# Patient Record
Sex: Male | Born: 1935 | Race: White | Hispanic: No | Marital: Single | Smoking: Never smoker
Health system: Southern US, Community
[De-identification: ages and names within clinical notes are randomized; demographics above are authoritative.]

## PROBLEM LIST (undated history)

## (undated) DIAGNOSIS — M4800 Spinal stenosis, site unspecified: Secondary | ICD-10-CM

## (undated) DIAGNOSIS — M5126 Other intervertebral disc displacement, lumbar region: Secondary | ICD-10-CM

## (undated) DIAGNOSIS — M5136 Other intervertebral disc degeneration, lumbar region: Secondary | ICD-10-CM

## (undated) DIAGNOSIS — K219 Gastro-esophageal reflux disease without esophagitis: Secondary | ICD-10-CM

## (undated) DIAGNOSIS — I472 Ventricular tachycardia: Secondary | ICD-10-CM

## (undated) DIAGNOSIS — J189 Pneumonia, unspecified organism: Secondary | ICD-10-CM

## (undated) DIAGNOSIS — M549 Dorsalgia, unspecified: Secondary | ICD-10-CM

## (undated) DIAGNOSIS — S42309A Unspecified fracture of shaft of humerus, unspecified arm, initial encounter for closed fracture: Secondary | ICD-10-CM

## (undated) DIAGNOSIS — I10 Essential (primary) hypertension: Secondary | ICD-10-CM

## (undated) DIAGNOSIS — M1990 Unspecified osteoarthritis, unspecified site: Secondary | ICD-10-CM

## (undated) DIAGNOSIS — M51369 Other intervertebral disc degeneration, lumbar region without mention of lumbar back pain or lower extremity pain: Secondary | ICD-10-CM

## (undated) DIAGNOSIS — M5430 Sciatica, unspecified side: Secondary | ICD-10-CM

## (undated) DIAGNOSIS — H409 Unspecified glaucoma: Secondary | ICD-10-CM

## (undated) HISTORY — PX: BUTTOCK MASS EXCISION: SHX1279

## (undated) HISTORY — PX: CATARACT EXTRACTION W/ INTRAOCULAR LENS  IMPLANT, BILATERAL: SHX1307

## (undated) HISTORY — PX: GROIN EXPLORATION: SHX1713

## (undated) HISTORY — PX: OTHER SURGICAL HISTORY: SHX169

---

## 1983-04-01 DIAGNOSIS — I472 Ventricular tachycardia, unspecified: Secondary | ICD-10-CM

## 1983-04-01 HISTORY — DX: Ventricular tachycardia: I47.2

## 1983-04-01 HISTORY — DX: Ventricular tachycardia, unspecified: I47.20

## 2007-06-18 ENCOUNTER — Ambulatory Visit: Payer: Self-pay | Admitting: Internal Medicine

## 2008-07-05 ENCOUNTER — Ambulatory Visit: Payer: Self-pay | Admitting: Internal Medicine

## 2008-11-16 ENCOUNTER — Ambulatory Visit: Payer: Self-pay | Admitting: Gastroenterology

## 2011-07-16 ENCOUNTER — Encounter: Payer: Self-pay | Admitting: Neurology

## 2011-07-30 ENCOUNTER — Encounter: Payer: Self-pay | Admitting: Neurology

## 2011-08-30 ENCOUNTER — Encounter: Payer: Self-pay | Admitting: Neurology

## 2011-11-19 ENCOUNTER — Ambulatory Visit: Payer: Self-pay | Admitting: Internal Medicine

## 2012-09-23 ENCOUNTER — Ambulatory Visit: Payer: Self-pay | Admitting: Internal Medicine

## 2014-01-24 ENCOUNTER — Other Ambulatory Visit: Payer: Self-pay | Admitting: Neurological Surgery

## 2014-02-21 ENCOUNTER — Inpatient Hospital Stay (HOSPITAL_COMMUNITY): Admission: RE | Admit: 2014-02-21 | Payer: Self-pay | Source: Ambulatory Visit

## 2014-04-10 ENCOUNTER — Other Ambulatory Visit: Payer: Self-pay | Admitting: Neurological Surgery

## 2014-04-18 ENCOUNTER — Other Ambulatory Visit (HOSPITAL_COMMUNITY): Payer: Self-pay

## 2014-04-26 ENCOUNTER — Encounter (HOSPITAL_COMMUNITY): Admission: RE | Payer: Self-pay | Source: Ambulatory Visit

## 2014-04-26 ENCOUNTER — Inpatient Hospital Stay (HOSPITAL_COMMUNITY): Admission: RE | Admit: 2014-04-26 | Payer: Self-pay | Source: Ambulatory Visit | Admitting: Neurological Surgery

## 2014-04-26 SURGERY — LUMBAR LAMINECTOMY/DECOMPRESSION MICRODISCECTOMY 2 LEVELS
Anesthesia: General | Site: Back | Laterality: Bilateral

## 2015-09-14 ENCOUNTER — Encounter: Payer: Self-pay | Admitting: Emergency Medicine

## 2015-09-14 ENCOUNTER — Emergency Department: Payer: Medicare HMO

## 2015-09-14 ENCOUNTER — Emergency Department
Admission: EM | Admit: 2015-09-14 | Discharge: 2015-09-15 | Disposition: A | Discharge status: Home / Self Care | Payer: Medicare HMO | Attending: Emergency Medicine | Admitting: Emergency Medicine

## 2015-09-14 DIAGNOSIS — Z79899 Other long term (current) drug therapy: Secondary | ICD-10-CM | POA: Diagnosis not present

## 2015-09-14 DIAGNOSIS — W1839XA Other fall on same level, initial encounter: Secondary | ICD-10-CM | POA: Diagnosis not present

## 2015-09-14 DIAGNOSIS — Y939 Activity, unspecified: Secondary | ICD-10-CM | POA: Diagnosis not present

## 2015-09-14 DIAGNOSIS — I10 Essential (primary) hypertension: Secondary | ICD-10-CM | POA: Insufficient documentation

## 2015-09-14 DIAGNOSIS — M79602 Pain in left arm: Secondary | ICD-10-CM

## 2015-09-14 DIAGNOSIS — S42292A Other displaced fracture of upper end of left humerus, initial encounter for closed fracture: Secondary | ICD-10-CM | POA: Diagnosis not present

## 2015-09-14 DIAGNOSIS — Y929 Unspecified place or not applicable: Secondary | ICD-10-CM | POA: Insufficient documentation

## 2015-09-14 DIAGNOSIS — S42302A Unspecified fracture of shaft of humerus, left arm, initial encounter for closed fracture: Secondary | ICD-10-CM

## 2015-09-14 DIAGNOSIS — Y999 Unspecified external cause status: Secondary | ICD-10-CM | POA: Insufficient documentation

## 2015-09-14 HISTORY — DX: Spinal stenosis, site unspecified: M48.00

## 2015-09-14 HISTORY — DX: Essential (primary) hypertension: I10

## 2015-09-14 HISTORY — DX: Dorsalgia, unspecified: M54.9

## 2015-09-14 HISTORY — DX: Ventricular tachycardia: I47.2

## 2015-09-14 HISTORY — DX: Other intervertebral disc displacement, lumbar region: M51.26

## 2015-09-14 HISTORY — DX: Sciatica, unspecified side: M54.30

## 2015-09-14 HISTORY — DX: Other intervertebral disc degeneration, lumbar region without mention of lumbar back pain or lower extremity pain: M51.369

## 2015-09-14 HISTORY — DX: Unspecified fracture of shaft of humerus, unspecified arm, initial encounter for closed fracture: S42.309A

## 2015-09-14 HISTORY — DX: Other intervertebral disc degeneration, lumbar region: M51.36

## 2015-09-14 MED ORDER — HYDROMORPHONE HCL 1 MG/ML IJ SOLN
1.0000 mg | Freq: Once | INTRAMUSCULAR | Status: AC
  Administered 2015-09-14: 1 mg via INTRAVENOUS
  Filled 2015-09-14: qty 1

## 2015-09-14 MED ORDER — ONDANSETRON HCL 4 MG/2ML IJ SOLN
4.0000 mg | Freq: Once | INTRAMUSCULAR | Status: AC
  Administered 2015-09-14: 4 mg via INTRAVENOUS
  Filled 2015-09-14: qty 2

## 2015-09-14 NOTE — ED Notes (Signed)
Pt states fell on concrete one week ago. Pt with proximal left humerus fracture. Pt states increased pain to left arm over 2 days. Pt with dark black bruising and swelling from elbow to approx 2 inches below shoulder. Pt with swelling and bruising noted to left hand. Arm is warm to touch, cms intact to left fingers.

## 2015-09-14 NOTE — ED Notes (Signed)
Call bell at right side, pt denies offer for warm blanket.

## 2015-09-14 NOTE — ED Notes (Signed)
Scotty CourtStafford ,MD consulted. MD made aware of presenting complaints and triage assessment. MD with VORB for PIV, Zofran 4mg  IVP, Dilaudid 1mg  IVP, and a plain film of the LEFT humerus. Orders to be entered and carried by this RN.

## 2015-09-15 MED ORDER — OXYCODONE-ACETAMINOPHEN 5-325 MG PO TABS: 1.0000 | ORAL_TABLET | ORAL | Status: DC | PRN

## 2015-09-15 MED ORDER — OXYCODONE-ACETAMINOPHEN 5-325 MG PO TABS
1.0000 | ORAL_TABLET | Freq: Once | ORAL | Status: AC
  Administered 2015-09-15: 1 via ORAL
  Filled 2015-09-15: qty 1

## 2015-09-15 NOTE — ED Provider Notes (Signed)
[facility] Provider Note   ____________________________________________  Time seen: Approximately 12:03 AM  I have reviewed the triage vital signs and the nursing notes.   HISTORY  Chief Complaint Arm Pain    HPI Paul Hobbs is a 80 y.o. male who presents to the ED from home via EMS with a chief complaint of left arm pain. Patient fell 1 week ago; mechanical fall secondary to "bad back". Denies LOC. Seen at urgent care with xrays demonstrating left humerus fracture. Ran out of vicodin today; also removed sling for the first time and noted extensive bruising to arm. Has an appointment with Dr. Joice Lofts next week but is concerned he will be in pain through the weekend. Denies recent fever, chills, chest pain, shortness of breath, abdominal pain, nausea, vomiting, diarrhea. Nothing makes his pain better; movement makes his pain worse.   Past Medical History  Diagnosis Date  . Humerus fracture   . Back pain   . Spinal stenosis   . Bulging lumbar disc   . Sciatica   . Hypertension   . V tach (HCC)     There are no active problems to display for this patient.   No past surgical history on file.  Current Outpatient Rx  Name  Route  Sig  Dispense  Refill  . acetaminophen (TYLENOL) 325 MG tablet   Oral   Take 650 mg by mouth every 6 (six) hours as needed for mild pain.         Marland Kitchen atorvastatin (LIPITOR) 20 MG tablet   Oral   Take 20 mg by mouth every evening.         . enalapril (VASOTEC) 20 MG tablet   Oral   Take 20 mg by mouth 2 (two) times daily.         Marland Kitchen esomeprazole (NEXIUM) 40 MG capsule   Oral   Take 40 mg by mouth daily at 12 noon.         . hydrochlorothiazide (HYDRODIURIL) 25 MG tablet   Oral   Take 25 mg by mouth daily.         Marland Kitchen HYDROcodone-acetaminophen (NORCO/VICODIN) 5-325 MG tablet   Oral   Take 1-2 tablets by mouth.         . Magnesium 250 MG TABS   Oral   Take 250 mg by mouth daily.         . metoprolol succinate (TOPROL-XL) 50 MG 24 hr tablet   Oral   Take 50 mg by mouth daily.         . Multiple Vitamin (MULTIVITAMIN WITH MINERALS) TABS tablet   Oral   Take 1 tablet by mouth daily.         Marland Kitchen omega-3 acid ethyl esters (LOVAZA) 1 g capsule   Oral   Take 1 g by mouth 2 (two) times daily.         . tamsulosin (FLOMAX) 0.4 MG CAPS capsule   Oral   Take 0.4 mg by mouth daily.         Marland Kitchen oxyCODONE-acetaminophen (ROXICET) 5-325 MG tablet   Oral   Take 1 tablet by mouth every 4 (four) hours as needed for severe pain.   30 tablet   0     Allergies Penicillins  No family history on file.  Social History Social History  Substance Use Topics  . Smoking status: Never Smoker   . Smokeless tobacco: Never Used  . Alcohol Use:  Yes    Review of Systems  Constitutional: No fever/chills. Eyes: No visual changes. ENT: No sore throat. Cardiovascular: Denies chest pain. Respiratory: Denies shortness of breath. Gastrointestinal: No abdominal pain.  No nausea, no vomiting.  No diarrhea.  No constipation. Genitourinary: Negative for dysuria. Musculoskeletal: Positive for left arm pain. Negative for back pain. Skin: Negative for rash. Neurological: Negative for headaches, focal weakness or numbness.  10-point ROS otherwise negative.  ____________________________________________   PHYSICAL EXAM:  VITAL SIGNS: ED Triage Vitals  Enc Vitals Group     BP 09/14/15 2228 172/80 mmHg     Pulse Rate 09/14/15 2228 79     Resp 09/14/15 2228 16     Temp 09/14/15 2228 97.9 F (36.6 C)     Temp Source 09/14/15 2228 Oral     SpO2 09/14/15 2228 98 %     Weight 09/14/15 2228 185 lb (83.915 kg)     Height 09/14/15 2228 5\' 8"  (1.727 m)     Head Cir --      Peak Flow --      Pain Score 09/14/15 2228 3     Pain Loc --      Pain Edu? --      Excl. in GC? --     Constitutional: Alert and oriented. Well appearing and in no acute distress. Eyes: Conjunctivae  are normal. PERRL. EOMI. Head: Atraumatic. Nose: No congestion/rhinnorhea. Mouth/Throat: Mucous membranes are moist.  Oropharynx non-erythematous. Neck: No stridor.  No cervical spine tenderness to palpation. Cardiovascular: Normal rate, regular rhythm. Grossly normal heart sounds.  Good peripheral circulation. Respiratory: Normal respiratory effort.  No retractions. Lungs CTAB. Gastrointestinal: Soft and nontender. No distention. No abdominal bruits. No CVA tenderness. Musculoskeletal:  LUE: Extensive eccymosis to left upper arm. Moderate swelling to arm, forearm and hand. Compartments are not tense nor tight; low suspicion for compartment syndrome. 2+ radial pulses. Limited ROM secondary to pain.  Neurologic:  Normal speech and language. No gross focal neurologic deficits are appreciated. No gait instability. Skin:  Skin is warm, dry and intact. No rash noted. Psychiatric: Mood and affect are normal. Speech and behavior are normal.  ____________________________________________   LABS (all labs ordered are listed, but only abnormal results are displayed)  Labs Reviewed - No data to display ____________________________________________  EKG  None  ____________________________________________  RADIOLOGY  Left humerus xrays (viewed by me, interpreted per Dr. Andria MeuseStevens): Comminuted, displaced, and impacted fractures of the proximal left humerus. Degenerative changes in the left shoulder and left elbow.  Left hand complete (viewed by me, interpreted per Dr. Andria MeuseStevens): Prominent degenerative changes throughout the left hand and wrist. No acute bony abnormalities. Soft tissue swelling. ____________________________________________   PROCEDURES  Procedure(s) performed: None  Critical Care performed: No  ____________________________________________   INITIAL IMPRESSION / ASSESSMENT AND PLAN / ED COURSE  Pertinent labs & imaging results that were available during my care of the  patient were reviewed by me and considered in my medical decision making (see chart for details).  80 year old male who presents 1 week s/p left humerus fracture with complaints of running out of pain medicines and associated fracture pain. Also had ortho PA Floyce StakesGaines evaluate patient in the ED as he [facility]. Will place in shoulder immobilizer, analgesia and patient will keep upcoming appointment as scheduled. Will also provide walker for stability. Strict return precautions given. Patient verbalizes understanding and agrees with plan of care. ____________________________________________   FINAL CLINICAL IMPRESSION(S) / ED  DIAGNOSES  Final diagnoses:  Humerus fracture, left, closed, initial encounter  Left arm pain      NEW MEDICATIONS STARTED DURING THIS VISIT:  New Prescriptions   OXYCODONE-ACETAMINOPHEN (ROXICET) 5-325 MG TABLET    Take 1 tablet by mouth every 4 (four) hours as needed for severe pain.     Note:  This document was prepared using Dragon voice recognition software and may include unintentional dictation errors.    Irean Hong, MD 09/15/15 [phone]

## 2015-09-15 NOTE — Discharge Instructions (Signed)
1. You may take pain medicine as needed (percocet #30). 2. Keep shoulder immobilizer in place. You may remove to bathe. 3. Return to the ER for worsening symptoms, persistent vomiting, difficulty breathing or other concerns.  Humerus Fracture Treated With Immobilization The humerus is the large bone in your upper arm. You have a broken (fractured) humerus. These fractures are easily diagnosed with X-rays. TREATMENT  Simple fractures which will heal without disability are treated with simple immobilization. Immobilization means you will wear a cast, splint, or sling. You have a fracture which will do well with immobilization. The fracture will heal well simply by being held in a good position until it is stable enough to begin range of motion exercises. Do not take part in activities which would further injure your arm.  HOME CARE INSTRUCTIONS   Put ice on the injured area.  Put ice in a plastic bag.  Place a towel between your skin and the bag.  Leave the ice on for 15-20 minutes, 03-04 times a day.  If you have a cast:  Do not scratch the skin under the cast using sharp or pointed objects.  Check the skin around the cast every day. You may put lotion on any red or sore areas.  Keep your cast dry and clean.  If you have a splint:  Wear the splint as directed.  Keep your splint dry and clean.  You may loosen the elastic around the splint if your fingers become numb, tingle, or turn cold or blue.  If you have a sling:  Wear the sling as directed.  Do not put pressure on any part of your cast or splint until it is fully hardened.  Your cast or splint can be protected during bathing with a plastic bag. Do not lower the cast or splint into water.  Only take over-the-counter or prescription medicines for pain, discomfort, or fever as directed by your caregiver.  Do range of motion exercises as instructed by your caregiver.  Follow up as directed by your caregiver. This is very  important in order to avoid permanent injury or disability and chronic pain. SEEK IMMEDIATE MEDICAL CARE IF:   Your skin or nails in the injured arm turn blue or gray.  Your arm feels cold or numb.  You develop severe pain in the injured arm.  You are having problems with the medicines you were given. MAKE SURE YOU:   Understand these instructions.  Will watch your condition.  Will get help right away if you are not doing well or get worse.   This information is not intended to replace advice given to you by your health care provider. Make sure you discuss any questions you have with your health care provider.   Document Released: 06/23/2000 Document Revised: 04/07/2014 Document Reviewed: 08/09/2014 Elsevier Interactive Patient Education [employer]2016 [employer]

## 2015-09-15 NOTE — ED Notes (Signed)
Discussed with pt discharge transporation options. Pt states is not going to be able to get in and out of a taxi. Pt states he would like an ambulance. Pt informed may be reponsible for ems bill of "several hundred dollars". Pt verbalizes understanding.

## 2015-09-17 ENCOUNTER — Emergency Department: Payer: Medicare HMO

## 2015-09-17 ENCOUNTER — Emergency Department
Admission: EM | Admit: 2015-09-17 | Discharge: 2015-09-17 | Disposition: A | Discharge status: Home / Self Care | Payer: Medicare HMO | Attending: Emergency Medicine | Admitting: Emergency Medicine

## 2015-09-17 ENCOUNTER — Encounter: Payer: Self-pay | Admitting: Emergency Medicine

## 2015-09-17 DIAGNOSIS — W19XXXD Unspecified fall, subsequent encounter: Secondary | ICD-10-CM | POA: Insufficient documentation

## 2015-09-17 DIAGNOSIS — I10 Essential (primary) hypertension: Secondary | ICD-10-CM | POA: Diagnosis not present

## 2015-09-17 DIAGNOSIS — S42302G Unspecified fracture of shaft of humerus, left arm, subsequent encounter for fracture with delayed healing: Secondary | ICD-10-CM

## 2015-09-17 DIAGNOSIS — Z79899 Other long term (current) drug therapy: Secondary | ICD-10-CM | POA: Diagnosis not present

## 2015-09-17 DIAGNOSIS — M79622 Pain in left upper arm: Secondary | ICD-10-CM | POA: Diagnosis present

## 2015-09-17 NOTE — ED Notes (Signed)
Pt with  humerous injury recently, was seen here Friday night and was told to come back to ED if more bruising, swelling or discoloration noted to right arm/hand. Pt states arm and hand are  Much worse.

## 2015-09-17 NOTE — ED Provider Notes (Signed)
[facility] Emergency Department Provider Note  ____________________________________________  Time seen: 11:30 AM  I have reviewed the triage vital signs and the nursing notes.   HISTORY  Chief Complaint Arm Swelling    HPI Paul Hobbs is a 80 y.o. male complaining of discoloration of the left hand concern for compartment syndrome. He had a fall about 1 week ago where he had a proximal humerus fracture. This was maintained in a sling, but then he ran out of his medication for pain control and came to the emergency department 3 days ago. At that time he was counseled that if he had worsening discoloration of the hand to come back to be evaluated for compartment syndrome. He reports his pain is actually very well controlled with a shoulder immobilizer that was placed on his last visit. He denies any coldness or numbness in the hand, no pain in the hand. Otherwise feels fine. He has a follow-up appointment with Dr. Joice [facility]Pog[facility] orthopedics tomorrow.  No chest pain or shortness of breath     Past Medical History  Diagnosis Date  . Humerus fracture   . Back pain   . Spinal stenosis   . Bulging lumbar disc   . Sciatica   . Hypertension   . V tach (HCC)      There are no active problems to display for this patient.    History reviewed. No pertinent past surgical history.   Current Outpatient Rx  Name  Route  Sig  Dispense  Refill  . acetaminophen (TYLENOL) 325 MG tablet   Oral   Take 650 mg by mouth every 6 (six) hours as needed for mild pain.         Marland Kitchen. atorvastatin (LIPITOR) 20 MG tablet   Oral   Take 20 mg by mouth every evening.         . enalapril (VASOTEC) 20 MG tablet   Oral   Take 20 mg by mouth 2 (two) times daily.         Marland Kitchen. esomeprazole (NEXIUM) 40 MG capsule   Oral   Take 40 mg by mouth daily at 12 noon.         . hydrochlorothiazide (HYDRODIURIL) 25 MG tablet   Oral   Take 25 mg by mouth daily.         Marland Kitchen.  HYDROcodone-acetaminophen (NORCO/VICODIN) 5-325 MG tablet   Oral   Take 1-2 tablets by mouth.         . Magnesium 250 MG TABS   Oral   Take 250 mg by mouth daily.         . metoprolol succinate (TOPROL-XL) 50 MG 24 hr tablet   Oral   Take 50 mg by mouth daily.         . Multiple Vitamin (MULTIVITAMIN WITH MINERALS) TABS tablet   Oral   Take 1 tablet by mouth daily.         Marland Kitchen. omega-3 acid ethyl esters (LOVAZA) 1 g capsule   Oral   Take 1 g by mouth 2 (two) times daily.         Marland Kitchen. oxyCODONE-acetaminophen (ROXICET) 5-325 MG tablet   Oral   Take 1 tablet by mouth every 4 (four) hours as needed for severe pain.   30 tablet   0   . tamsulosin (FLOMAX) 0.4 MG CAPS capsule   Oral   Take 0.4 mg by mouth daily.  Allergies Penicillins   No family history on file.  Social History Social History  Substance Use Topics  . Smoking status: Never Smoker   . Smokeless tobacco: Never Used  . Alcohol Use: Yes    Review of Systems  Constitutional:   No fever or chills.  Eyes:   No vision changes.  ENT:   No sore throat. No rhinorrhea. Cardiovascular:   No chest pain. Respiratory:   No dyspnea or cough. Gastrointestinal:   Negative for abdominal pain, vomiting and diarrhea.  Genitourinary:   Negative for dysuria or difficulty urinating. Musculoskeletal:   Left upper arm pain due to fracture Neurological:   Negative for headaches 10-point ROS otherwise negative.  ____________________________________________   PHYSICAL EXAM:  VITAL SIGNS: ED Triage Vitals  Enc Vitals Group     BP 09/17/15 1053 133/50 mmHg     Pulse Rate 09/17/15 1053 65     Resp 09/17/15 1053 20     Temp 09/17/15 1053 97.7 F (36.5 C)     Temp Source 09/17/15 1053 Oral     SpO2 09/17/15 1053 100 %     Weight --      Height --      Head Cir --      Peak Flow --      Pain Score --      Pain Loc --      Pain Edu? --      Excl. in GC? --     Vital signs reviewed, nursing  assessments reviewed.   Constitutional:   Alert and oriented. Well appearing and in no distress. Eyes:   No scleral icterus. No conjunctival pallor. PERRL. EOMI.  No nystagmus. ENT   Head:   Normocephalic and atraumatic.   Nose:   No congestion/rhinnorhea. No septal hematoma   Mouth/Throat:   MMM, no pharyngeal erythema. No peritonsillar mass.    Neck:   No stridor. No SubQ emphysema. No meningismus. Hematological/Lymphatic/Immunilogical:   No cervical lymphadenopathy. Cardiovascular:   RRR. Symmetric bilateral radial and DP pulses.  No murmurs. Normal capillary refill in all fingers of the left hand. Respiratory:   Normal respiratory effort without tachypnea nor retractions. Breath sounds are clear and equal bilaterally. No wheezes/rales/rhonchi. Gastrointestinal:   Soft and nontender. Non distended. There is no CVA tenderness.  No rebound, rigidity, or guarding. Genitourinary:   deferred Musculoskeletal:   Diffuse ecchymosis of the left upper extremity and the upper arm forearm and hand. There is also some diffuse mild edema. Tissues are soft, not tense. No crepitus. Full range of motion in the wrist and fingers.. Neurologic:   Normal speech and language.  CN 2-10 normal. Motor grossly intact. No gross focal neurologic deficits are appreciated.  Skin:    Skin is warm, dry and intact. Diffuse ecchymosis over the left upper extremity. There is ecchymosis over the fingers as well, but no bony tenderness. Skin is warm..  ____________________________________________    LABS (pertinent positives/negatives) (all labs ordered are listed, but only abnormal results are displayed) Labs Reviewed - No data to display ____________________________________________   EKG    ____________________________________________    RADIOLOGY  Chest x-ray  unremarkable  ____________________________________________   PROCEDURES   ____________________________________________   INITIAL IMPRESSION / ASSESSMENT AND PLAN / ED COURSE  Pertinent labs & imaging results that were available during my care of the patient were reviewed by me and considered in my medical decision making (see chart for details).  Patient seen to be evaluated for compartment  syndrome because of return precautions given previously. No evidence of compartment syndrome or soft tissue infection or new injury today. Pain is controlled. We'll discharge to follow-up with Dr. Joice Lofts orthopedics tomorrow. 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 This was completed. Vital signs unremarkable, discharge home, no evidence of DVT or PE.     ____________________________________________   FINAL CLINICAL IMPRESSION(S) / ED DIAGNOSES  Final diagnoses:  Humerus fracture, left, with delayed healing, subsequent encounter       Portions of this note were generated with dragon dictation software. Dictation errors may occur despite best attempts at proofreading.   Sharman Cheek, MD 09/17/15 1332

## 2015-12-19 ENCOUNTER — Other Ambulatory Visit: Payer: Self-pay | Admitting: Neurological Surgery

## 2015-12-19 DIAGNOSIS — M48062 Spinal stenosis, lumbar region with neurogenic claudication: Secondary | ICD-10-CM

## 2015-12-31 ENCOUNTER — Ambulatory Visit: Payer: Medicare HMO

## 2016-01-07 ENCOUNTER — Ambulatory Visit
Admission: RE | Admit: 2016-01-07 | Discharge: 2016-01-07 | Disposition: A | Discharge status: Home / Self Care | Payer: Medicare HMO | Source: Ambulatory Visit | Attending: Neurological Surgery | Admitting: Neurological Surgery

## 2016-01-07 DIAGNOSIS — M48061 Spinal stenosis, lumbar region without neurogenic claudication: Secondary | ICD-10-CM | POA: Diagnosis present

## 2016-01-07 DIAGNOSIS — M5136 Other intervertebral disc degeneration, lumbar region: Secondary | ICD-10-CM | POA: Insufficient documentation

## 2016-01-07 DIAGNOSIS — M48062 Spinal stenosis, lumbar region with neurogenic claudication: Secondary | ICD-10-CM

## 2016-01-07 DIAGNOSIS — M5126 Other intervertebral disc displacement, lumbar region: Secondary | ICD-10-CM | POA: Insufficient documentation

## 2016-02-13 HISTORY — PX: BACK SURGERY: SHX140

## 2016-03-15 ENCOUNTER — Emergency Department
Admission: EM | Admit: 2016-03-15 | Discharge: 2016-03-15 | Disposition: A | Discharge status: Home / Self Care | Payer: Medicare HMO | Attending: Emergency Medicine | Admitting: Emergency Medicine

## 2016-03-15 ENCOUNTER — Emergency Department: Payer: Medicare HMO

## 2016-03-15 ENCOUNTER — Encounter: Payer: Self-pay | Admitting: Emergency Medicine

## 2016-03-15 DIAGNOSIS — Z79899 Other long term (current) drug therapy: Secondary | ICD-10-CM | POA: Insufficient documentation

## 2016-03-15 DIAGNOSIS — R42 Dizziness and giddiness: Secondary | ICD-10-CM | POA: Diagnosis not present

## 2016-03-15 DIAGNOSIS — I10 Essential (primary) hypertension: Secondary | ICD-10-CM | POA: Diagnosis not present

## 2016-03-15 DIAGNOSIS — R001 Bradycardia, unspecified: Secondary | ICD-10-CM | POA: Insufficient documentation

## 2016-03-15 LAB — COMPREHENSIVE METABOLIC PANEL
ALT: 19 U/L (ref 17–63)
ANION GAP: 7 (ref 5–15)
AST: 25 U/L (ref 15–41)
Albumin: 3.8 g/dL (ref 3.5–5.0)
Alkaline Phosphatase: 86 U/L (ref 38–126)
BILIRUBIN TOTAL: 0.7 mg/dL (ref 0.3–1.2)
BUN: 19 mg/dL (ref 6–20)
CO2: 28 mmol/L (ref 22–32)
Calcium: 8.8 mg/dL — ABNORMAL LOW (ref 8.9–10.3)
Chloride: 99 mmol/L — ABNORMAL LOW (ref 101–111)
Creatinine, Ser: 1.02 mg/dL (ref 0.61–1.24)
GFR calc Af Amer: >60 mL/min (ref >60)
Glucose, Bld: 110 mg/dL — ABNORMAL HIGH (ref 65–99)
POTASSIUM: 3.8 mmol/L (ref 3.5–5.1)
Sodium: 134 mmol/L — ABNORMAL LOW (ref 135–145)
TOTAL PROTEIN: 6.7 g/dL (ref 6.5–8.1)

## 2016-03-15 LAB — URINALYSIS, COMPLETE (UACMP) WITH MICROSCOPIC
Bacteria, UA: NONE SEEN
Bilirubin Urine: NEGATIVE
GLUCOSE, UA: NEGATIVE mg/dL
Hgb urine dipstick: NEGATIVE
KETONES UR: NEGATIVE mg/dL
LEUKOCYTES UA: NEGATIVE
Nitrite: NEGATIVE
PH: 7 (ref 5.0–8.0)
Protein, ur: NEGATIVE mg/dL
SPECIFIC GRAVITY, URINE: 1.005 (ref 1.005–1.030)
SQUAMOUS EPITHELIAL / LPF: NONE SEEN
WBC, UA: NONE SEEN WBC/hpf (ref 0–5)

## 2016-03-15 LAB — CBC WITH DIFFERENTIAL/PLATELET
Basophils Absolute: 0.1 10*3/uL (ref 0–0.1)
Basophils Relative: 2 %
Eosinophils Absolute: 0.2 10*3/uL (ref 0–0.7)
Eosinophils Relative: 3 %
HEMATOCRIT: 39 % — AB (ref 40.0–52.0)
Hemoglobin: 13.6 g/dL (ref 13.0–18.0)
LYMPHS PCT: 41 %
Lymphs Abs: 2.7 10*3/uL (ref 1.0–3.6)
MCH: 27.9 pg (ref 26.0–34.0)
MCHC: 35 g/dL (ref 32.0–36.0)
MCV: 79.9 fL — AB (ref 80.0–100.0)
MONO ABS: 0.7 10*3/uL (ref 0.2–1.0)
MONOS PCT: 11 %
NEUTROS ABS: 2.8 10*3/uL (ref 1.4–6.5)
Neutrophils Relative %: 43 %
Platelets: 256 10*3/uL (ref 150–440)
RBC: 4.89 MIL/uL (ref 4.40–5.90)
RDW: 14.1 % (ref 11.5–14.5)
WBC: 6.5 10*3/uL (ref 3.8–10.6)

## 2016-03-15 LAB — TROPONIN I: Troponin I: <0.03 ng/mL (ref <0.03)

## 2016-03-15 MED ORDER — SODIUM CHLORIDE 0.9 % IV BOLUS (SEPSIS)
1000.0000 mL | Freq: Once | INTRAVENOUS | Status: AC
  Administered 2016-03-15: 1000 mL via INTRAVENOUS

## 2016-03-15 MED ORDER — MECLIZINE HCL 25 MG PO TABS
25.0000 mg | ORAL_TABLET | Freq: Once | ORAL | Status: AC
  Administered 2016-03-15: 25 mg via ORAL
  Filled 2016-03-15: qty 1

## 2016-03-15 NOTE — ED Notes (Signed)
Pt verbalized understanding of discharge instructions. NAD at this time. 

## 2016-03-15 NOTE — ED Provider Notes (Signed)
-----------------------------------------   8:14 AM on 03/15/2016 -----------------------------------------   Blood pressure (!) 181/60, pulse (!) 51, temperature 98.3 F (36.8 C), temperature source Oral, resp. rate 17, height 5\' 8"  (1.727 m), weight 175 lb (79.4 kg), SpO2 98 %.  Assuming care from Dr. Dolores FrameSung of [facility] [place] is a 80 y.o. male with a chief complaint of Dizziness .    In summary, 80 year old male who presented with months of lightheadedness and weakness. Patient was found to be bradycardic and is currently on toprol mg qd. I was asked by Dr. Dolores FrameSung to follow up the results of the second troponin which is now back and is negative. Patient will be discharged home with instructions provided by her and decreasing his dose of metoprolol by half and close follow-up with patient's PCP. Patient is comfortable with this plan at this time.   Nita Sicklearolina Zyan Mirkin, MD 03/15/16 [phone]

## 2016-03-15 NOTE — Discharge Instructions (Signed)
1. Cut your Toprol-XL dosage by half to 25 mg daily. 2. Drink plenty of fluids daily. 3. Return to the ER for worsening symptoms, persistent vomiting, difficulty breathing or other concerns.

## 2016-03-15 NOTE — ED Triage Notes (Signed)
Pt coming from home via EMS for dizziness and weakness x couple weeks but stated he could barely walk tonight. Pt states he feels very light headed.

## 2016-03-15 NOTE — ED Notes (Signed)
Pt felt lightheaded but no more than normal for all BP's

## 2016-03-15 NOTE — ED Provider Notes (Signed)
[facility] Emergency Department Provider Note   ____________________________________________   First MD Initiated Contact with Patient 03/15/16 [phone]     (approximate)  I have reviewed the triage vital signs and the nursing notes.   HISTORY  Chief Complaint Dizziness    HPI FREDDERICK Hobbs is a 80 y.o. male who presents to the ED from home via EMS with a chief complaint of dizziness and generalized weakness. Patient reports symptoms "for a while", more notable over the past 2 weeks. Describes sensation of feeling lightheaded as well as room spinning. Symptoms are not associated with nausea or vomiting. Symptoms are more pronounced when patient is standing. Denies recent fever, chills, headache, vision changes, neck pain, chest pain, shortness of breath, abdominal pain, nausea, vomiting, diarrhea. Denies recent travel or trauma.Tonight patient became so dizzy that he could barely walk. Denies slurred speech, extremity weakness, numbness or  tingling.  Past Medical History:  Diagnosis Date  . Back pain   . Bulging lumbar disc   . Humerus fracture   . Hypertension   . Sciatica   . Spinal stenosis   . V tach (HCC)     There are no active problems to display for this patient.   Past Surgical History:  Procedure Laterality Date  . BACK SURGERY  02/13/2016    Prior to Admission medications   Medication Sig Start Date End Date Taking? Authorizing Provider  acetaminophen (TYLENOL) 325 MG tablet Take 650 mg by mouth every 6 (six) hours as needed for mild pain.    Historical Provider, MD  atorvastatin (LIPITOR) 20 MG tablet Take 20 mg by mouth every evening. 07/13/15   Historical Provider, MD  enalapril (VASOTEC) 20 MG tablet Take 20 mg by mouth 2 (two) times daily.    Historical Provider, MD  esomeprazole (NEXIUM) 40 MG capsule Take 40 mg by mouth daily at 12 noon.    Historical Provider, MD  hydrochlorothiazide (HYDRODIURIL) 25 MG tablet Take 25 mg by  mouth daily.    Historical Provider, MD  HYDROcodone-acetaminophen (NORCO/VICODIN) 5-325 MG tablet Take 1-2 tablets by mouth.    Historical Provider, MD  Magnesium 250 MG TABS Take 250 mg by mouth daily.    Historical Provider, MD  metoprolol succinate (TOPROL-XL) 50 MG 24 hr tablet Take 50 mg by mouth daily.    Historical Provider, MD  Multiple Vitamin (MULTIVITAMIN WITH MINERALS) TABS tablet Take 1 tablet by mouth daily.    Historical Provider, MD  omega-3 acid ethyl esters (LOVAZA) 1 g capsule Take 1 g by mouth 2 (two) times daily.    Historical Provider, MD  oxyCODONE-acetaminophen (ROXICET) 5-325 MG tablet Take 1 tablet by mouth every 4 (four) hours as needed for severe pain. 09/15/15   Irean Hong, MD  tamsulosin (FLOMAX) 0.4 MG CAPS capsule Take 0.4 mg by mouth daily.    Historical Provider, MD    Allergies Penicillins  History reviewed. No pertinent family history.  Social History Social History  Substance Use Topics  . Smoking status: Never Smoker  . Smokeless tobacco: Never Used  . Alcohol use Yes    Review of Systems  Constitutional: No fever/chills. Eyes: No visual changes. ENT: No sore throat. Cardiovascular: Denies chest pain. Respiratory: Denies shortness of breath. Gastrointestinal: No abdominal pain.  No nausea, no vomiting.  No diarrhea.  No constipation. Genitourinary: Negative for dysuria. Musculoskeletal: Negative for back pain. Skin: Negative for rash. Neurological: positive for lightheadedness and dizziness. Negative for headaches, focal weakness  or numbness.  10-point ROS otherwise negative.  ____________________________________________   PHYSICAL EXAM:  VITAL SIGNS: ED Triage Vitals  Enc Vitals Group     BP 03/15/16 0422 (!) 181/67     Pulse Rate 03/15/16 0422 (!) 58     Resp 03/15/16 0422 18     Temp 03/15/16 0422 98.3 F (36.8 C)     Temp Source 03/15/16 0422 Oral     SpO2 03/15/16 0422 98 %     Weight 03/15/16 0426 175 lb (79.4 kg)      Height 03/15/16 0426 5\' 8"  (1.727 m)     Head Circumference --      Peak Flow --      Pain Score 03/15/16 0426 0     Pain Loc --      Pain Edu? --      Excl. in GC? --     Constitutional: Alert and oriented. Well appearing and in no acute distress. Eyes: Conjunctivae are normal. PERRL. EOMI. Head: Atraumatic. Nose: No congestion/rhinnorhea. Mouth/Throat: Mucous membranes are moist.  Oropharynx non-erythematous. Neck: No stridor.  Supple neck without meningismus.  No carotid bruits. Cardiovascular: Normal rate, regular rhythm. Grossly normal heart sounds.  Good peripheral circulation. Respiratory: Normal respiratory effort.  No retractions. Lungs CTAB. Gastrointestinal: Soft and nontender. No distention. No abdominal bruits. No CVA tenderness. Musculoskeletal: No lower extremity tenderness nor edema.  No joint effusions. Neurologic:  Normal speech and language. CN II-XII sling intact. No gross focal neurologic deficits are appreciated.  Skin:  Skin is warm, dry and intact. No rash noted. Psychiatric: Mood and affect are normal. Speech and behavior are normal.  ____________________________________________   LABS (all labs ordered are listed, but only abnormal results are displayed)  Labs Reviewed  CBC WITH DIFFERENTIAL/PLATELET - Abnormal; Notable for the following:       Result Value   HCT 39.0 (*)    MCV 79.9 (*)    All other components within normal limits  COMPREHENSIVE METABOLIC PANEL - Abnormal; Notable for the following:    Sodium 134 (*)    Chloride 99 (*)    Glucose, Bld 110 (*)    Calcium 8.8 (*)    All other components within normal limits  TROPONIN I  URINALYSIS, COMPLETE (UACMP) WITH MICROSCOPIC   ____________________________________________  EKG  ED ECG REPORT I, Asaph Serena J, the attending physician, personally viewed and interpreted this ECG.   Date: 03/15/2016  EKG Time: 0423  Rate: 48  Rhythm: sinus bradycardia  Axis: normal  Intervals:none  ST&T  Change: nonspecific  ____________________________________________  RADIOLOGY  CT head interpreted per Dr. Gwenyth Benderadparvar: No acute intracranial pathology.    Mild age-related atrophy and chronic microvascular ischemic changes.   ____________________________________________   PROCEDURES  Procedure(s) performed: None  Procedures  Critical Care performed: No  ____________________________________________   INITIAL IMPRESSION / ASSESSMENT AND PLAN / ED COURSE  Pertinent labs & imaging results that were available during my care of the patient were reviewed by me and considered in my medical decision making (see chart for details).  80 year old male who presents with lightheadedness and dizziness. Sinus bradycardia noted. Review of patient's medicines reveal that he is on Toprol 50 mg XL daily. This is likely causing patient's sensation of lightheadedness and generalized weakness. Will cut this in half. IV fluids running; lab work pending. Will reassess.  Clinical Course as of Mar 15 714  Sat Mar 15, 2016  [id number] Patient is overall feeling better. He was not orthostatic. Will repeat  timed troponin. Anticipate discharge home if second troponin remains negative. Will ask patient to cut his Toprol 50 mg XL dose by half and to follow-up with his PCP in 2 days. Strict return precautions given. Patient verbalizes understanding and agrees with plan of care. Care transferred to Dr. Don PerkingVeronese pending second troponin.  [JS]    Clinical Course User Index [JS] Irean HongJade J Ali Mclaurin, MD     ____________________________________________   FINAL CLINICAL IMPRESSION(S) / ED DIAGNOSES  Final diagnoses:  Dizziness  Lightheadedness  Bradycardia      NEW MEDICATIONS STARTED DURING THIS VISIT:  New Prescriptions   No medications on file     Note:  This document was prepared using Dragon voice recognition software and may include unintentional dictation errors.    Irean HongJade J Bryndle Corredor, MD 03/15/16 [phone]

## 2017-05-04 ENCOUNTER — Other Ambulatory Visit: Payer: Self-pay | Admitting: Neurological Surgery

## 2017-05-04 DIAGNOSIS — R200 Anesthesia of skin: Secondary | ICD-10-CM

## 2017-05-13 ENCOUNTER — Ambulatory Visit
Admission: RE | Admit: 2017-05-13 | Discharge: 2017-05-13 | Disposition: A | Discharge status: Home / Self Care | Payer: Medicare HMO | Source: Ambulatory Visit | Attending: Neurological Surgery | Admitting: Neurological Surgery

## 2017-05-13 DIAGNOSIS — M47816 Spondylosis without myelopathy or radiculopathy, lumbar region: Secondary | ICD-10-CM | POA: Diagnosis not present

## 2017-05-13 DIAGNOSIS — R200 Anesthesia of skin: Secondary | ICD-10-CM | POA: Insufficient documentation

## 2017-05-13 DIAGNOSIS — M48061 Spinal stenosis, lumbar region without neurogenic claudication: Secondary | ICD-10-CM | POA: Diagnosis not present

## 2017-05-13 LAB — POCT I-STAT CREATININE: Creatinine, Ser: 0.7 mg/dL (ref 0.61–1.24)

## 2017-05-13 MED ORDER — GADOBENATE DIMEGLUMINE 529 MG/ML IV SOLN
15.0000 mL | Freq: Once | INTRAVENOUS | Status: AC | PRN
  Administered 2017-05-13: 15 mL via INTRAVENOUS

## 2017-05-26 ENCOUNTER — Other Ambulatory Visit: Payer: Self-pay | Admitting: Neurological Surgery

## 2017-06-29 NOTE — Pre-Procedure Instructions (Signed)
Fabian SharpDavid I Petrella  06/29/2017      [facility] [place] - [address] Renee Harder[address] [place] [address] Phone: [phone] Fax: [phone]    Your procedure is scheduled on Fri., Jul 31, 2017  [facility] "A" at 8:30AM  Call this number if you have problems the morning of surgery:  [phone]   Remember:  Do not eat food or drink liquids after midnight.  Take these medicines the morning of surgery with A SIP OF WATER: Metoprolol succinate (TOPROL-XL) and Tamsulosin (FLOMAX). If needed Acetaminophen (TYLENOL) for mild pain and OxyCODONE-acetaminophen (ROXICET) for severe pain.  7 days before surgery (April 26), stop taking all Aspirins, Vitamins, Fish oils, and Herbal medications. Also stop all NSAIDS i.e. Advil, Ibuprofen, Motrin, Aleve, Anaprox, Naproxen, BC and Goody Powders.   Do not wear jewelry.  Do not wear lotions, powders, colognes, or deodorant.  Do not shave 48 hours prior to surgery.  Men may shave face.  Do not bring valuables to the hospital.  [facility] is not responsible for any belongings or valuables.  Contacts, dentures or bridgework may not be worn into surgery.  Leave your suitcase in the car.  After surgery it may be brought to your room.  For patients admitted to the hospital, discharge time will be determined by your treatment team.  Patients discharged the day of surgery will not be allowed to drive home.   Special instructions:   [facility]- Preparing For Surgery  Before surgery, you can play an important role. Because skin is not sterile, your skin needs to be as free of germs as possible. You can reduce the number of germs on your skin by washing with CHG (chlorahexidine gluconate) Soap before surgery.  CHG is an antiseptic cleaner which kills germs and bonds with the skin to continue killing germs even after washing.  Please do not use if you have an allergy to CHG or antibacterial  soaps. If your skin becomes reddened/irritated stop using the CHG.  Do not shave (including legs and underarms) for at least 48 hours prior to first CHG shower. It is OK to shave your face.  Please follow these instructions carefully.   1. Shower the NIGHT BEFORE SURGERY and the MORNING OF SURGERY with CHG.   2. If you chose to wash your hair, wash your hair first as usual with your normal shampoo.  3. After you shampoo, rinse your hair and body thoroughly to remove the shampoo.  4. Use CHG as you would any other liquid soap. You can apply CHG directly to the skin and wash gently with a scrungie or a clean washcloth.   5. Apply the CHG Soap to your body ONLY FROM THE NECK DOWN.  Do not use on open wounds or open sores. Avoid contact with your eyes, ears, mouth and genitals (private parts). Wash Face and genitals (private parts)  with your normal soap.  6. Wash thoroughly, paying special attention to the area where your surgery will be performed.  7. Thoroughly rinse your body with warm water from the neck down.  8. DO NOT shower/wash with your normal soap after using and rinsing off the CHG Soap.  9. Pat yourself dry with a CLEAN TOWEL.  10. Wear CLEAN PAJAMAS to bed the night before surgery, wear comfortable clothes the morning of surgery  11. Place CLEAN SHEETS on your bed the night of your first shower  and DO NOT SLEEP WITH PETS.  Day of Surgery: Do not apply any deodorants/lotions. Please wear clean clothes to the hospital/surgery center.    Please read over the following fact sheets that you were given. Pain Booklet, Coughing and Deep Breathing, MRSA Information and Surgical Site Infection Prevention

## 2017-06-30 ENCOUNTER — Inpatient Hospital Stay (HOSPITAL_COMMUNITY)
Admission: RE | Admit: 2017-06-30 | Discharge: 2017-06-30 | Disposition: A | Discharge status: Home / Self Care | Payer: Medicare HMO | Source: Ambulatory Visit

## 2017-07-22 ENCOUNTER — Other Ambulatory Visit: Payer: Self-pay

## 2017-07-22 ENCOUNTER — Encounter (HOSPITAL_COMMUNITY)
Admission: RE | Admit: 2017-07-22 | Discharge: 2017-07-22 | Disposition: A | Discharge status: Home / Self Care | Payer: Medicare HMO | Source: Ambulatory Visit | Attending: Neurological Surgery | Admitting: Neurological Surgery

## 2017-07-22 ENCOUNTER — Encounter (HOSPITAL_COMMUNITY): Payer: Self-pay

## 2017-07-22 DIAGNOSIS — I10 Essential (primary) hypertension: Secondary | ICD-10-CM | POA: Diagnosis not present

## 2017-07-22 DIAGNOSIS — Z01818 Encounter for other preprocedural examination: Secondary | ICD-10-CM | POA: Insufficient documentation

## 2017-07-22 HISTORY — DX: Unspecified osteoarthritis, unspecified site: M19.90

## 2017-07-22 HISTORY — DX: Gastro-esophageal reflux disease without esophagitis: K21.9

## 2017-07-22 LAB — SURGICAL PCR SCREEN
MRSA, PCR: NEGATIVE
Staphylococcus aureus: NEGATIVE

## 2017-07-22 LAB — TYPE AND SCREEN
ABO/RH(D): O POS
Antibody Screen: NEGATIVE

## 2017-07-22 LAB — BASIC METABOLIC PANEL
Anion gap: 9 (ref 5–15)
BUN: 11 mg/dL (ref 6–20)
CHLORIDE: 103 mmol/L (ref 101–111)
CO2: 26 mmol/L (ref 22–32)
Calcium: 9.1 mg/dL (ref 8.9–10.3)
Creatinine, Ser: 0.85 mg/dL (ref 0.61–1.24)
GFR calc Af Amer: >60 mL/min (ref >60)
GFR calc non Af Amer: >60 mL/min (ref >60)
Glucose, Bld: 90 mg/dL (ref 65–99)
POTASSIUM: 4.1 mmol/L (ref 3.5–5.1)
Sodium: 138 mmol/L (ref 135–145)

## 2017-07-22 LAB — CBC
HCT: 37.4 % — ABNORMAL LOW (ref 39.0–52.0)
HEMOGLOBIN: 12.6 g/dL — AB (ref 13.0–17.0)
MCH: 28.6 pg (ref 26.0–34.0)
MCHC: 33.7 g/dL (ref 30.0–36.0)
MCV: 84.8 fL (ref 78.0–100.0)
Platelets: 260 10*3/uL (ref 150–400)
RBC: 4.41 MIL/uL (ref 4.22–5.81)
RDW: 14.4 % (ref 11.5–15.5)
WBC: 8.3 10*3/uL (ref 4.0–10.5)

## 2017-07-22 LAB — ABO/RH: ABO/RH(D): O POS

## 2017-07-22 NOTE — Pre-Procedure Instructions (Signed)
Paul Hobbs  07/22/2017      [facility] - [place], [place] - [address] Paul [place] [address] Phone: [phone] Fax: [phone]    Your procedure is scheduled on  Wednesday 08/05/17  Report to [facility] Admitting at 1015 A.M.  Call this number if you have problems the morning of surgery:  [phone]   Remember:  Do not eat food or drink liquids after midnight.  Take these medicines the morning of surgery with A SIP OF WATER - METOPROLOL (TOPROL XL) AND TAMSULOSIN (FLOMAX).  IF NEEDED ACETAMINOPHEN (TYLENOL) FOR MILD PAIN AND OXYCODONE (ROXICET) FOR SEVERE PAIN.  7 days prior to surgery STOP taking any Aspirin(unless otherwise instructed by your surgeon), Aleve, Naproxen, Ibuprofen, Motrin, Advil, Goody's, BC's, all herbal medications, fish oil, and all vitamins   Do not wear jewelry, make-up or nail polish.  Do not wear lotions, powders, or perfumes, or deodorant.  Do not shave 48 hours prior to surgery.  Men may shave face and neck.  Do not bring valuables to the hospital.  [facility] is not responsible for any belongings or valuables.  Contacts, dentures or bridgework may not be worn into surgery.  Leave your suitcase in the car.  After surgery it may be brought to your room.  For patients admitted to the hospital, discharge time will be determined by your treatment team.  Patients discharged the day of surgery will not be allowed to drive home.   Name and phone number of your driver:    Special instructions:  [facility] - Preparing for Surgery  Before surgery, you can play an important role.  Because skin is not sterile, your skin needs to be as free of germs as possible.  You can reduce the number of germs on you skin by washing with CHG (chlorahexidine gluconate) soap before surgery.  CHG is an antiseptic cleaner which kills germs and bonds with the skin to continue killing germs even after washing.  Please DO NOT use if  you have an allergy to CHG or antibacterial soaps.  If your skin becomes reddened/irritated stop using the CHG and inform your nurse when you arrive at Short Stay.  Do not shave (including legs and underarms) for at least 48 hours prior to the first CHG shower.  You may shave your face.  Please follow these instructions carefully:   1.  Shower with CHG Soap the night before surgery and the                                morning of Surgery.  2.  If you choose to wash your hair, wash your hair first as usual with your       normal shampoo.  3.  After you shampoo, rinse your hair and body thoroughly to remove the                      Shampoo.  4.  Use CHG as you would any other liquid soap.  You can apply chg directly       to the skin and wash gently with scrungie or a clean washcloth.  5.  Apply the CHG Soap to your body ONLY FROM THE NECK DOWN.        Do not use on open wounds or open sores.  Avoid contact with your eyes,  ears, mouth and genitals (private parts).  Wash genitals (private parts)       with your normal soap.  6.  Wash thoroughly, paying special attention to the area where your surgery        will be performed.  7.  Thoroughly rinse your body with warm water from the neck down.  8.  DO NOT shower/wash with your normal soap after using and rinsing off       the CHG Soap.  9.  Pat yourself dry with a clean towel.            10.  Wear clean pajamas.            11.  Place clean sheets on your bed the night of your first shower and do not        sleep with pets.  Day of Surgery  Do not apply any lotions/deoderants the morning of surgery.  Please wear clean clothes to the hospital/surgery center.     Please read over the following fact sheets that you were given. Pain Booklet, MRSA Information and Surgical Site Infection Prevention

## 2017-07-22 NOTE — Pre-Procedure Instructions (Signed)
Paul Hobbs  07/22/2017      [facility] - [place], [place] - [address] [place] [address] Phone: [phone] Fax: [phone]    Your procedure is scheduled on  Wednesday 08/05/17  Report to [facility] Admitting at 1015 A.M.  Call this number if you have problems the morning of surgery:  [phone]   Remember:  Do not eat food or drink liquids after midnight.  Take these medicines the morning of surgery with A SIP OF WATER -  AMLODIPINE (NORVASC), FINASTERIDE, EYE DROPS.    IF NEEDED ACETAMINOPHEN (TYLENOL)  7 days prior to surgery STOP taking any Aspirin(unless otherwise instructed by your surgeon), Aleve, Naproxen, Ibuprofen, Motrin, Advil, Goody's, BC's, all herbal medications, fish oil, and all vitamins   Do not wear jewelry, make-up or nail polish.  Do not wear lotions, powders, or perfumes, or deodorant.  Do not shave 48 hours prior to surgery.  Men may shave face and neck.  Do not bring valuables to the [facility].  [facility] is not responsible for any belongings or valuables.  Contacts, dentures or bridgework may not be worn into surgery.  Leave your suitcase in the car.  After surgery it may be brought to your room.  For patients admitted to the [facility], discharge time will be determined by your treatment team.  Patients discharged the day of surgery will not be allowed to drive home.   Name and phone number of your driver:    Special instructions:  [facility] - Preparing for Surgery  Before surgery, you can play an important role.  Because skin is not sterile, your skin needs to be as free of germs as possible.  You can reduce the number of germs on you skin by washing with CHG (chlorahexidine gluconate) soap before surgery.  CHG is an antiseptic cleaner which kills germs and bonds with the skin to continue killing germs even after washing.  Please DO NOT use if you have an allergy to CHG or antibacterial soaps.  If  your skin becomes reddened/irritated stop using the CHG and inform your nurse when you arrive at Short Stay.  Do not shave (including legs and underarms) for at least 48 hours prior to the first CHG shower.  You may shave your face.  Please follow these instructions carefully:   1.  Shower with CHG Soap the night before surgery and the                                morning of Surgery.  2.  If you choose to wash your hair, wash your hair first as usual with your       normal shampoo.  3.  After you shampoo, rinse your hair and body thoroughly to remove the                      Shampoo.  4.  Use CHG as you would any other liquid soap.  You can apply chg directly       to the skin and wash gently with scrungie or a clean washcloth.  5.  Apply the CHG Soap to your body ONLY FROM THE NECK DOWN.        Do not use on open wounds or open sores.  Avoid contact with your eyes,       ears, mouth  and genitals (private parts).  Wash genitals (private parts)       with your normal soap.  6.  Wash thoroughly, paying special attention to the area where your surgery        will be performed.  7.  Thoroughly rinse your body with warm water from the neck down.  8.  DO NOT shower/wash with your normal soap after using and rinsing off       the CHG Soap.  9.  Pat yourself dry with a clean towel.            10.  Wear clean pajamas.            11.  Place clean sheets on your bed the night of your first shower and do not        sleep with pets.  Day of Surgery  Do not apply any lotions/deoderants the morning of surgery.  Please wear clean clothes to the [facility]/surgery [facility].     Please read over the following fact sheets that you were given. Pain Booklet, MRSA Information and Surgical Site Infection Prevention

## 2017-07-22 NOTE — Pre-Procedure Instructions (Signed)
Paul SharpDavid I Hobbs  07/22/2017      [facility] [place] - [address] Paul Chew[address] [place] [address] Phone: [phone] Fax: [phone]    Your procedure is scheduled on  Wednesday 08/05/17  [facility] at 1015 A.M.  Call this number if you have problems the morning of surgery:  [phone]   Remember:  Do not eat food or drink liquids after midnight.  Take these medicines the morning of surgery with A SIP OF WATER - METOPROLOL (TOPROL XL) AND TAMSULOSIN (FLOMAX), NEXIUM.    IF NEEDED ACETAMINOPHEN (TYLENOL) FOR MILD PAIN AND OXYCODONE (ROXICET) FOR SEVERE PAIN.  7 days prior to surgery STOP taking any Aspirin(unless otherwise instructed by your surgeon), Aleve, Naproxen, Ibuprofen, Motrin, Advil, Goody's, BC's, all herbal medications, fish oil, and all vitamins   Do not wear jewelry, make-up or nail polish.  Do not wear lotions, powders, or perfumes, or deodorant.  Do not shave 48 hours prior to surgery.  Men may shave face and neck.  Do not bring valuables to the hospital.  [facility] is not responsible for any belongings or valuables.  Contacts, dentures or bridgework may not be worn into surgery.  Leave your suitcase in the car.  After surgery it may be brought to your room.  For patients admitted to the hospital, discharge time will be determined by your treatment team.  Patients discharged the day of surgery will not be allowed to drive home.   Name and phone number of your driver:    Special instructions:  [facility] - Preparing for Surgery  Before surgery, you can play an important role.  Because skin is not sterile, your skin needs to be as free of germs as possible.  You can reduce the number of germs on you skin by washing with CHG (chlorahexidine gluconate) soap before surgery.  CHG is an antiseptic cleaner which kills germs and bonds with the skin to continue killing germs even after washing.  Please DO  NOT use if you have an allergy to CHG or antibacterial soaps.  If your skin becomes reddened/irritated stop using the CHG and inform your nurse when you arrive at Short Stay.  Do not shave (including legs and underarms) for at least 48 hours prior to the first CHG shower.  You may shave your face.  Please follow these instructions carefully:   1.  Shower with CHG Soap the night before surgery and the                                morning of Surgery.  2.  If you choose to wash your hair, wash your hair first as usual with your       normal shampoo.  3.  After you shampoo, rinse your hair and body thoroughly to remove the                      Shampoo.  4.  Use CHG as you would any other liquid soap.  You can apply chg directly       to the skin and wash gently with scrungie or a clean washcloth.  5.  Apply the CHG Soap to your body ONLY FROM THE NECK DOWN.        Do not use on open wounds or open sores.  Avoid contact with  your eyes,       ears, mouth and genitals (private parts).  Wash genitals (private parts)       with your normal soap.  6.  Wash thoroughly, paying special attention to the area where your surgery        will be performed.  7.  Thoroughly rinse your body with warm water from the neck down.  8.  DO NOT shower/wash with your normal soap after using and rinsing off       the CHG Soap.  9.  Pat yourself dry with a clean towel.            10.  Wear clean pajamas.            11.  Place clean sheets on your bed the night of your first shower and do not        sleep with pets.  Day of Surgery  Do not apply any lotions/deoderants the morning of surgery.  Please wear clean clothes to the hospital/surgery center.     Please read over the following fact sheets that you were given. Pain Booklet, MRSA Information and Surgical Site Infection Prevention

## 2017-08-05 ENCOUNTER — Inpatient Hospital Stay (HOSPITAL_COMMUNITY): Payer: Medicare HMO

## 2017-08-05 ENCOUNTER — Inpatient Hospital Stay (HOSPITAL_COMMUNITY)
Admission: RE | Disposition: A | Discharge status: Home / Self Care | Payer: Self-pay | Source: Home / Self Care | Attending: Neurological Surgery

## 2017-08-05 ENCOUNTER — Inpatient Hospital Stay (HOSPITAL_COMMUNITY): Payer: Medicare HMO | Admitting: Anesthesiology

## 2017-08-05 ENCOUNTER — Encounter (HOSPITAL_COMMUNITY): Payer: Self-pay | Admitting: Neurological Surgery

## 2017-08-05 ENCOUNTER — Inpatient Hospital Stay (HOSPITAL_COMMUNITY)
Admission: RE | Admit: 2017-08-05 | Discharge: 2017-08-07 | DRG: 455 | Disposition: A | Discharge status: Home / Self Care | Payer: Medicare HMO | Attending: Neurological Surgery | Admitting: Neurological Surgery

## 2017-08-05 DIAGNOSIS — Z961 Presence of intraocular lens: Secondary | ICD-10-CM | POA: Diagnosis present

## 2017-08-05 DIAGNOSIS — Z880 Allergy status to penicillin: Secondary | ICD-10-CM

## 2017-08-05 DIAGNOSIS — Z885 Allergy status to narcotic agent status: Secondary | ICD-10-CM

## 2017-08-05 DIAGNOSIS — M48061 Spinal stenosis, lumbar region without neurogenic claudication: Principal | ICD-10-CM | POA: Diagnosis present

## 2017-08-05 DIAGNOSIS — Z79899 Other long term (current) drug therapy: Secondary | ICD-10-CM | POA: Diagnosis not present

## 2017-08-05 DIAGNOSIS — M4316 Spondylolisthesis, lumbar region: Secondary | ICD-10-CM | POA: Diagnosis present

## 2017-08-05 DIAGNOSIS — I10 Essential (primary) hypertension: Secondary | ICD-10-CM | POA: Diagnosis present

## 2017-08-05 DIAGNOSIS — Z981 Arthrodesis status: Secondary | ICD-10-CM

## 2017-08-05 DIAGNOSIS — Z7982 Long term (current) use of aspirin: Secondary | ICD-10-CM

## 2017-08-05 DIAGNOSIS — Z419 Encounter for procedure for purposes other than remedying health state, unspecified: Secondary | ICD-10-CM

## 2017-08-05 DIAGNOSIS — K219 Gastro-esophageal reflux disease without esophagitis: Secondary | ICD-10-CM | POA: Diagnosis present

## 2017-08-05 DIAGNOSIS — Z9841 Cataract extraction status, right eye: Secondary | ICD-10-CM | POA: Diagnosis not present

## 2017-08-05 DIAGNOSIS — Z9842 Cataract extraction status, left eye: Secondary | ICD-10-CM | POA: Diagnosis not present

## 2017-08-05 DIAGNOSIS — M1990 Unspecified osteoarthritis, unspecified site: Secondary | ICD-10-CM | POA: Diagnosis present

## 2017-08-05 DIAGNOSIS — M545 Low back pain: Secondary | ICD-10-CM | POA: Diagnosis present

## 2017-08-05 DIAGNOSIS — Z85828 Personal history of other malignant neoplasm of skin: Secondary | ICD-10-CM | POA: Diagnosis not present

## 2017-08-05 DIAGNOSIS — Z888 Allergy status to other drugs, medicaments and biological substances status: Secondary | ICD-10-CM | POA: Diagnosis not present

## 2017-08-05 HISTORY — PX: TRANSFORAMINAL LUMBAR INTERBODY FUSION (TLIF) WITH PEDICLE SCREW FIXATION 1 LEVEL: SHX6141

## 2017-08-05 SURGERY — TRANSFORAMINAL LUMBAR INTERBODY FUSION (TLIF) WITH PEDICLE SCREW FIXATION 1 LEVEL
Anesthesia: General | Site: Back

## 2017-08-05 MED ORDER — SODIUM CHLORIDE 0.9% FLUSH: 3.0000 mL | Freq: Two times a day (BID) | INTRAVENOUS | Status: DC

## 2017-08-05 MED ORDER — PROPOFOL 10 MG/ML IV BOLUS
INTRAVENOUS | Status: AC
  Filled 2017-08-05: qty 20

## 2017-08-05 MED ORDER — SODIUM CHLORIDE 0.9 % IV SOLN
INTRAVENOUS | Status: DC | PRN
  Administered 2017-08-05: 16:00:00

## 2017-08-05 MED ORDER — BUPIVACAINE HCL (PF) 0.25 % IJ SOLN
INTRAMUSCULAR | Status: DC | PRN
  Administered 2017-08-05: 2 mL

## 2017-08-05 MED ORDER — FENTANYL CITRATE (PF) 100 MCG/2ML IJ SOLN
INTRAMUSCULAR | Status: AC
  Administered 2017-08-05: 50 ug via INTRAVENOUS
  Filled 2017-08-05: qty 2

## 2017-08-05 MED ORDER — VANCOMYCIN HCL IN DEXTROSE 750-5 MG/150ML-% IV SOLN
750.0000 mg | Freq: Once | INTRAVENOUS | Status: AC
  Administered 2017-08-05: 750 mg via INTRAVENOUS
  Filled 2017-08-05: qty 150

## 2017-08-05 MED ORDER — ONDANSETRON HCL 4 MG PO TABS: 4.0000 mg | ORAL_TABLET | Freq: Four times a day (QID) | ORAL | Status: DC | PRN

## 2017-08-05 MED ORDER — HYDROMORPHONE HCL 1 MG/ML IJ SOLN: 0.5000 mg | INTRAMUSCULAR | Status: DC | PRN

## 2017-08-05 MED ORDER — SODIUM CHLORIDE 0.9 % IV SOLN: 250.0000 mL | INTRAVENOUS | Status: DC

## 2017-08-05 MED ORDER — OXYCODONE HCL 5 MG PO TABS: 5.0000 mg | ORAL_TABLET | ORAL | Status: DC | PRN

## 2017-08-05 MED ORDER — MENTHOL 3 MG MT LOZG: 1.0000 | LOZENGE | OROMUCOSAL | Status: DC | PRN

## 2017-08-05 MED ORDER — PHENOL 1.4 % MT LIQD
1.0000 | OROMUCOSAL | Status: DC | PRN
  Administered 2017-08-06: 1 via OROMUCOSAL
  Filled 2017-08-05: qty 177

## 2017-08-05 MED ORDER — ADULT MULTIVITAMIN W/MINERALS CH
1.0000 | ORAL_TABLET | Freq: Every day | ORAL | Status: DC
  Administered 2017-08-05 – 2017-08-07 (×3): 1 via ORAL
  Filled 2017-08-05 (×3): qty 1

## 2017-08-05 MED ORDER — DEXAMETHASONE SODIUM PHOSPHATE 10 MG/ML IJ SOLN
INTRAMUSCULAR | Status: AC
  Filled 2017-08-05: qty 1

## 2017-08-05 MED ORDER — PHENYLEPHRINE 40 MCG/ML (10ML) SYRINGE FOR IV PUSH (FOR BLOOD PRESSURE SUPPORT)
PREFILLED_SYRINGE | INTRAVENOUS | Status: AC
  Filled 2017-08-05: qty 10

## 2017-08-05 MED ORDER — ASPIRIN EC 81 MG PO TBEC
81.0000 mg | DELAYED_RELEASE_TABLET | Freq: Every day | ORAL | Status: DC
  Administered 2017-08-05 – 2017-08-07 (×3): 81 mg via ORAL
  Filled 2017-08-05 (×3): qty 1

## 2017-08-05 MED ORDER — FENTANYL CITRATE (PF) 100 MCG/2ML IJ SOLN
INTRAMUSCULAR | Status: AC
  Filled 2017-08-05: qty 2

## 2017-08-05 MED ORDER — METHOCARBAMOL 500 MG PO TABS
ORAL_TABLET | ORAL | Status: AC
  Filled 2017-08-05: qty 1

## 2017-08-05 MED ORDER — VANCOMYCIN HCL 1000 MG IV SOLR
INTRAVENOUS | Status: DC | PRN
  Administered 2017-08-05: 1000 mg via TOPICAL

## 2017-08-05 MED ORDER — SUGAMMADEX SODIUM 200 MG/2ML IV SOLN
INTRAVENOUS | Status: DC | PRN
  Administered 2017-08-05: 131.6 mg via INTRAVENOUS

## 2017-08-05 MED ORDER — CELECOXIB 200 MG PO CAPS
200.0000 mg | ORAL_CAPSULE | Freq: Two times a day (BID) | ORAL | Status: DC
  Administered 2017-08-05 – 2017-08-07 (×4): 200 mg via ORAL
  Filled 2017-08-05 (×4): qty 1

## 2017-08-05 MED ORDER — SODIUM CHLORIDE 0.9% FLUSH: 3.0000 mL | INTRAVENOUS | Status: DC | PRN

## 2017-08-05 MED ORDER — THROMBIN (RECOMBINANT) 20000 UNITS EX SOLR
CUTANEOUS | Status: DC | PRN
  Administered 2017-08-05: 16:00:00 via TOPICAL

## 2017-08-05 MED ORDER — ONDANSETRON HCL 4 MG/2ML IJ SOLN
INTRAMUSCULAR | Status: DC | PRN
  Administered 2017-08-05: 4 mg via INTRAVENOUS

## 2017-08-05 MED ORDER — CHLORHEXIDINE GLUCONATE CLOTH 2 % EX PADS: 6.0000 | MEDICATED_PAD | Freq: Once | CUTANEOUS | Status: DC

## 2017-08-05 MED ORDER — ONDANSETRON HCL 4 MG/2ML IJ SOLN
INTRAMUSCULAR | Status: AC
  Filled 2017-08-05: qty 2

## 2017-08-05 MED ORDER — VANCOMYCIN HCL IN DEXTROSE 1-5 GM/200ML-% IV SOLN
1000.0000 mg | INTRAVENOUS | Status: AC
  Administered 2017-08-05 (×2): 1000 mg via INTRAVENOUS

## 2017-08-05 MED ORDER — METHOCARBAMOL 1000 MG/10ML IJ SOLN
500.0000 mg | Freq: Four times a day (QID) | INTRAVENOUS | Status: DC | PRN
  Filled 2017-08-05: qty 5

## 2017-08-05 MED ORDER — LIDOCAINE HCL (CARDIAC) PF 100 MG/5ML IV SOSY
PREFILLED_SYRINGE | INTRAVENOUS | Status: DC | PRN
  Administered 2017-08-05: 30 mg via INTRAVENOUS

## 2017-08-05 MED ORDER — SUFENTANIL CITRATE 50 MCG/ML IV SOLN
INTRAVENOUS | Status: DC | PRN
  Administered 2017-08-05: 5 ug via INTRAVENOUS
  Administered 2017-08-05 (×3): 10 ug via INTRAVENOUS

## 2017-08-05 MED ORDER — ENALAPRIL MALEATE 20 MG PO TABS
20.0000 mg | ORAL_TABLET | Freq: Two times a day (BID) | ORAL | Status: DC
  Administered 2017-08-05 – 2017-08-07 (×4): 20 mg via ORAL
  Filled 2017-08-05 (×4): qty 1

## 2017-08-05 MED ORDER — ACETAMINOPHEN 325 MG PO TABS
650.0000 mg | ORAL_TABLET | ORAL | Status: DC | PRN
  Administered 2017-08-05 – 2017-08-06 (×3): 650 mg via ORAL
  Filled 2017-08-05 (×3): qty 2

## 2017-08-05 MED ORDER — VANCOMYCIN HCL 1000 MG IV SOLR
INTRAVENOUS | Status: AC
  Filled 2017-08-05: qty 1000

## 2017-08-05 MED ORDER — 0.9 % SODIUM CHLORIDE (POUR BTL) OPTIME
TOPICAL | Status: DC | PRN
  Administered 2017-08-05: 1000 mL

## 2017-08-05 MED ORDER — AMLODIPINE BESYLATE 5 MG PO TABS
5.0000 mg | ORAL_TABLET | Freq: Two times a day (BID) | ORAL | Status: DC
  Administered 2017-08-05 – 2017-08-07 (×4): 5 mg via ORAL
  Filled 2017-08-05 (×4): qty 1

## 2017-08-05 MED ORDER — ROCURONIUM BROMIDE 50 MG/5ML IV SOLN
INTRAVENOUS | Status: AC
  Filled 2017-08-05: qty 1

## 2017-08-05 MED ORDER — THROMBIN (RECOMBINANT) 5000 UNITS EX SOLR
OROMUCOSAL | Status: DC | PRN
  Administered 2017-08-05: 16:00:00 via TOPICAL

## 2017-08-05 MED ORDER — LACTATED RINGERS IV SOLN
INTRAVENOUS | Status: DC | PRN
  Administered 2017-08-05 (×2): via INTRAVENOUS

## 2017-08-05 MED ORDER — ONDANSETRON HCL 4 MG/2ML IJ SOLN: 4.0000 mg | Freq: Four times a day (QID) | INTRAMUSCULAR | Status: DC | PRN

## 2017-08-05 MED ORDER — BUPIVACAINE HCL (PF) 0.25 % IJ SOLN
INTRAMUSCULAR | Status: AC
  Filled 2017-08-05: qty 30

## 2017-08-05 MED ORDER — ROCURONIUM BROMIDE 100 MG/10ML IV SOLN
INTRAVENOUS | Status: DC | PRN
  Administered 2017-08-05: 50 mg via INTRAVENOUS

## 2017-08-05 MED ORDER — LIDOCAINE 2% (20 MG/ML) 5 ML SYRINGE
INTRAMUSCULAR | Status: AC
  Filled 2017-08-05: qty 5

## 2017-08-05 MED ORDER — SUFENTANIL CITRATE 50 MCG/ML IV SOLN
INTRAVENOUS | Status: AC
  Filled 2017-08-05: qty 1

## 2017-08-05 MED ORDER — SUGAMMADEX SODIUM 200 MG/2ML IV SOLN
INTRAVENOUS | Status: AC
  Filled 2017-08-05: qty 2

## 2017-08-05 MED ORDER — THROMBIN 5000 UNITS EX SOLR
CUTANEOUS | Status: AC
  Filled 2017-08-05: qty 10000

## 2017-08-05 MED ORDER — FINASTERIDE 5 MG PO TABS
5.0000 mg | ORAL_TABLET | Freq: Every day | ORAL | Status: DC
  Administered 2017-08-06 – 2017-08-07 (×2): 5 mg via ORAL
  Filled 2017-08-05 (×3): qty 1

## 2017-08-05 MED ORDER — VANCOMYCIN HCL IN DEXTROSE 1-5 GM/200ML-% IV SOLN
INTRAVENOUS | Status: AC
  Administered 2017-08-05: 1000 mg via INTRAVENOUS
  Filled 2017-08-05: qty 200

## 2017-08-05 MED ORDER — TIMOLOL MALEATE 0.5 % OP SOLN
1.0000 [drp] | Freq: Two times a day (BID) | OPHTHALMIC | Status: DC
  Administered 2017-08-06 (×2): 1 [drp] via OPHTHALMIC
  Filled 2017-08-05: qty 5

## 2017-08-05 MED ORDER — LACTATED RINGERS IV SOLN
INTRAVENOUS | Status: DC
  Administered 2017-08-05: 12:00:00 via INTRAVENOUS

## 2017-08-05 MED ORDER — THROMBIN 20000 UNITS EX SOLR
CUTANEOUS | Status: AC
  Filled 2017-08-05: qty 20000

## 2017-08-05 MED ORDER — DEXAMETHASONE SODIUM PHOSPHATE 10 MG/ML IJ SOLN
INTRAMUSCULAR | Status: DC | PRN
  Administered 2017-08-05: 10 mg via INTRAVENOUS

## 2017-08-05 MED ORDER — METHOCARBAMOL 500 MG PO TABS
500.0000 mg | ORAL_TABLET | Freq: Four times a day (QID) | ORAL | Status: DC | PRN
  Administered 2017-08-05 – 2017-08-07 (×4): 500 mg via ORAL
  Filled 2017-08-05 (×3): qty 1

## 2017-08-05 MED ORDER — PROPOFOL 10 MG/ML IV BOLUS
INTRAVENOUS | Status: DC | PRN
  Administered 2017-08-05: 150 mg via INTRAVENOUS

## 2017-08-05 MED ORDER — FENTANYL CITRATE (PF) 100 MCG/2ML IJ SOLN
25.0000 ug | INTRAMUSCULAR | Status: DC | PRN
  Administered 2017-08-05 (×3): 50 ug via INTRAVENOUS

## 2017-08-05 MED ORDER — ACETAMINOPHEN 650 MG RE SUPP: 650.0000 mg | RECTAL | Status: DC | PRN

## 2017-08-05 MED ORDER — POTASSIUM CHLORIDE IN NACL 20-0.9 MEQ/L-% IV SOLN: INTRAVENOUS | Status: DC

## 2017-08-05 MED ORDER — SENNA 8.6 MG PO TABS
1.0000 | ORAL_TABLET | Freq: Two times a day (BID) | ORAL | Status: DC
  Administered 2017-08-05 – 2017-08-07 (×4): 8.6 mg via ORAL
  Filled 2017-08-05 (×4): qty 1

## 2017-08-05 SURGICAL SUPPLY — 66 items
BAG DECANTER FOR FLEXI CONT (MISCELLANEOUS) ×3 IMPLANT
BASKET BONE COLLECTION (BASKET) ×3 IMPLANT
BENZOIN TINCTURE PRP APPL 2/3 (GAUZE/BANDAGES/DRESSINGS) ×3 IMPLANT
BLADE CLIPPER SURG (BLADE) IMPLANT
BUR MATCHSTICK NEURO 3.0 LAGG (BURR) ×3 IMPLANT
CAGE POROUS TI PC 9X10X30 10D (Cage) ×3 IMPLANT
CANISTER SUCT 3000ML PPV (MISCELLANEOUS) ×3 IMPLANT
CARTRIDGE OIL MAESTRO DRILL (MISCELLANEOUS) ×1 IMPLANT
CLOSURE WOUND 1/2 X4 (GAUZE/BANDAGES/DRESSINGS) ×1
CONT SPEC 4OZ CLIKSEAL STRL BL (MISCELLANEOUS) ×3 IMPLANT
COVER BACK TABLE 60X90IN (DRAPES) ×3 IMPLANT
DERMABOND ADVANCED (GAUZE/BANDAGES/DRESSINGS) ×2
DERMABOND ADVANCED .7 DNX12 (GAUZE/BANDAGES/DRESSINGS) ×1 IMPLANT
DIFFUSER DRILL AIR PNEUMATIC (MISCELLANEOUS) ×3 IMPLANT
DRAPE C-ARM 42X72 X-RAY (DRAPES) ×3 IMPLANT
DRAPE C-ARMOR (DRAPES) ×3 IMPLANT
DRAPE LAPAROTOMY 100X72X124 (DRAPES) ×3 IMPLANT
DRAPE POUCH INSTRU U-SHP 10X18 (DRAPES) IMPLANT
DRAPE SURG 17X23 STRL (DRAPES) IMPLANT
DRSG OPSITE POSTOP 4X6 (GAUZE/BANDAGES/DRESSINGS) ×3 IMPLANT
DURAPREP 26ML APPLICATOR (WOUND CARE) ×3 IMPLANT
ELECT REM PT RETURN 9FT ADLT (ELECTROSURGICAL) ×3
ELECTRODE REM PT RTRN 9FT ADLT (ELECTROSURGICAL) ×1 IMPLANT
EVACUATOR 1/8 PVC DRAIN (DRAIN) IMPLANT
FIBER BOAT ALLOFUSE 7.5CC (Bone Implant) ×3 IMPLANT
GAUZE SPONGE 4X4 16PLY XRAY LF (GAUZE/BANDAGES/DRESSINGS) IMPLANT
GLOVE BIO SURGEON STRL SZ7 (GLOVE) ×3 IMPLANT
GLOVE BIO SURGEON STRL SZ8 (GLOVE) ×6 IMPLANT
GLOVE BIOGEL PI IND STRL 6.5 (GLOVE) ×2 IMPLANT
GLOVE BIOGEL PI IND STRL 7.0 (GLOVE) ×1 IMPLANT
GLOVE BIOGEL PI INDICATOR 6.5 (GLOVE) ×4
GLOVE BIOGEL PI INDICATOR 7.0 (GLOVE) ×2
GLOVE SS BIOGEL STRL SZ 7 (GLOVE) ×1 IMPLANT
GLOVE SUPERSENSE BIOGEL SZ 7 (GLOVE) ×2
GLOVE SURG SS PI 6.0 STRL IVOR (GLOVE) ×9 IMPLANT
GLOVE SURG SS PI 6.5 STRL IVOR (GLOVE) ×6 IMPLANT
GOWN STRL REUS W/ TWL LRG LVL3 (GOWN DISPOSABLE) ×3 IMPLANT
GOWN STRL REUS W/ TWL XL LVL3 (GOWN DISPOSABLE) ×2 IMPLANT
GOWN STRL REUS W/TWL 2XL LVL3 (GOWN DISPOSABLE) IMPLANT
GOWN STRL REUS W/TWL LRG LVL3 (GOWN DISPOSABLE) ×6
GOWN STRL REUS W/TWL XL LVL3 (GOWN DISPOSABLE) ×4
HEMOSTAT POWDER KIT SURGIFOAM (HEMOSTASIS) ×3 IMPLANT
KIT BASIN OR (CUSTOM PROCEDURE TRAY) ×3 IMPLANT
KIT TURNOVER KIT B (KITS) ×3 IMPLANT
MILL MEDIUM DISP (BLADE) ×3 IMPLANT
NEEDLE HYPO 25X1 1.5 SAFETY (NEEDLE) ×3 IMPLANT
NS IRRIG 1000ML POUR BTL (IV SOLUTION) ×3 IMPLANT
OIL CARTRIDGE MAESTRO DRILL (MISCELLANEOUS) ×3
PACK LAMINECTOMY NEURO (CUSTOM PROCEDURE TRAY) ×3 IMPLANT
PAD ARMBOARD 7.5X6 YLW CONV (MISCELLANEOUS) ×9 IMPLANT
PUTTY DBM 5CC ×3 IMPLANT
ROD PC 5.5X40 TI ARSENAL (Rod) ×3 IMPLANT
ROD PC 5.5X45 TI ARSENAL (Rod) ×3 IMPLANT
SCREW CORT CANC 5.5X40 (Screw) ×12 IMPLANT
SCREW SET SPINAL ARSENAL 47127 (Screw) ×12 IMPLANT
SPONGE LAP 4X18 X RAY DECT (DISPOSABLE) IMPLANT
SPONGE SURGIFOAM ABS GEL 100 (HEMOSTASIS) ×3 IMPLANT
STRIP CLOSURE SKIN 1/2X4 (GAUZE/BANDAGES/DRESSINGS) ×2 IMPLANT
SUT VIC AB 0 CT1 18XCR BRD8 (SUTURE) ×1 IMPLANT
SUT VIC AB 0 CT1 8-18 (SUTURE) ×2
SUT VIC AB 2-0 CP2 18 (SUTURE) ×3 IMPLANT
SUT VIC AB 3-0 SH 8-18 (SUTURE) ×6 IMPLANT
TOWEL GREEN STERILE (TOWEL DISPOSABLE) ×3 IMPLANT
TOWEL GREEN STERILE FF (TOWEL DISPOSABLE) ×3 IMPLANT
TRAY FOLEY MTR SLVR 16FR STAT (SET/KITS/TRAYS/PACK) ×3 IMPLANT
WATER STERILE IRR 1000ML POUR (IV SOLUTION) ×3 IMPLANT

## 2017-08-05 NOTE — Anesthesia Procedure Notes (Signed)
Procedure Name: Intubation Date/Time: 08/05/2017 3:17 PM Performed by: Eligha Bridegroom, CRNA Pre-anesthesia Checklist: Patient identified, Emergency Drugs available, Suction available, Patient being monitored and Timeout performed Patient Re-evaluated:Patient Re-evaluated prior to induction Oxygen Delivery Method: Circle system utilized Preoxygenation: Pre-oxygenation with 100% oxygen Induction Type: IV induction Ventilation: Mask ventilation without difficulty and Oral airway inserted - appropriate to patient size Laryngoscope Size: Mac and 4 Grade View: Grade II Tube type: Oral Tube size: 7.5 mm Number of attempts: 1 Airway Equipment and Method: Stylet Secured at: 22 cm Tube secured with: Tape Dental Injury: Teeth and Oropharynx as per pre-operative assessment

## 2017-08-05 NOTE — H&P (Signed)
Subjective: Patient is a 82 y.o. male admitted for L4-5 fusion. Onset of symptoms was several months ago, gradually worsening since that time.  The pain is rated severe, and is located at the across the lower back and radiates to RLE. The pain is described as aching and occurs all day. The symptoms have been progressive. Symptoms are exacerbated by exercise. MRI or CT showed recurrent stenosis L4-5with instability   Past Medical History:  Diagnosis Date  . Arthritis   . Back pain   . Bulging lumbar disc   . GERD (gastroesophageal reflux disease)   . Humerus fracture   . Hypertension   . Sciatica   . Spinal stenosis   . V tach [facility])     Past Surgical History:  Procedure Laterality Date  . BACK SURGERY  02/13/2016  . BUTTOCK MASS EXCISION    . CATARACT EXTRACTION W/ INTRAOCULAR LENS  IMPLANT, BILATERAL     LASER PRIOR   . GROIN EXPLORATION     RIGHT  . SKIN CANCERS     MULTIPLE    Prior to Admission medications   Medication Sig Start Date End Date Taking? Authorizing Provider  acetaminophen (TYLENOL) 325 MG tablet Take 650 mg by mouth every 6 (six) hours as needed for mild pain.   Yes [provider]  amLODipine (NORVASC) 5 MG tablet Take 5 mg by mouth 2 (two) times daily.    Yes [provider]  atorvastatin (LIPITOR) 10 MG tablet Take 10 mg by mouth every evening.  07/13/15  Yes [provider]  enalapril (VASOTEC) 20 MG tablet Take 20 mg by mouth 2 (two) times daily.   Yes [provider]  finasteride (PROSCAR) 5 MG tablet Take 5 mg by mouth daily.   Yes [provider]  Magnesium 250 MG TABS Take 250 mg by mouth daily.   Yes [provider]  Multiple Vitamin (MULTIVITAMIN WITH MINERALS) TABS tablet Take 1 tablet by mouth daily.   Yes [provider]  omega-3 acid ethyl esters (LOVAZA) 1 g capsule Take 1 g by mouth 2 (two) times daily.   Yes [provider]  timolol (TIMOPTIC) 0.5 % ophthalmic solution Place 1  drop into both eyes 2 (two) times daily.   Yes [provider]  aspirin EC 81 MG tablet Take 81 mg by mouth daily.    [provider]   Allergies  Allergen Reactions  . Ezetimibe Other (See Comments)    Myalgias  . Gemfibrozil Other (See Comments)    Myalgias  . Lovastatin Other (See Comments)    Myalgias  . Penicillins Swelling and Other (See Comments)    PATIENT HAS HAD A PCN REACTION WITH IMMEDIATE RASH, FACIAL/TONGUE/THROAT SWELLING, SOB, OR LIGHTHEADEDNESS WITH HYPOTENSION:  #  #  YES  #  #  Has patient had a PCN reaction causing severe rash involving mucus membranes or skin necrosis: No Has patient had a PCN reaction that required hospitalization No Has patient had a PCN reaction occurring within the last 10 years: No If all of the above answers are "NO", then may proceed with Cephalosporin use.   Marland Kitchen Hydrocodone Other (See Comments)    HALLUCINATIONS  . Morphine And Related Other (See Comments)    ALL OPIOIDS Pt reports Halluncinations    Social History   Tobacco Use  . Smoking status: Never Smoker  . Smokeless tobacco: Never Used  Substance Use Topics  . Alcohol use: Yes    Comment: 1-2 NIGHT  History reviewed. No pertinent family history.   Review of Systems  Positive ROS: neg  All other systems have been reviewed and were otherwise negative with the exception of those mentioned in the HPI and as above.  Objective: Vital signs in last 24 hours:    General Appearance: Alert, cooperative, no distress, appears stated age Head: Normocephalic, without obvious abnormality, atraumatic Eyes: PERRL, conjunctiva/corneas clear, EOM'Hobbs intact    Neck: Supple, symmetrical, trachea midline Back: Symmetric, no curvature, ROM normal, no CVA tenderness Lungs:  respirations unlabored Heart: Regular rate and rhythm Abdomen: Soft, non-tender Extremities: Extremities normal, atraumatic, no cyanosis or edema Pulses: 2+ and symmetric all extremities Skin:  Skin color, texture, turgor normal, no rashes or lesions  NEUROLOGIC:   Mental status: Alert and oriented x4,  no aphasia, good attention span, fund of knowledge, and memory Motor Exam - grossly normal Sensory Exam - grossly normal Reflexes: trace Coordination - grossly normal Gait - grossly normal Balance - grossly normal Cranial Nerves: I: smell Not tested  II: visual acuity  OS: nl    OD: nl  II: visual fields Full to confrontation  II: pupils Equal, round, reactive to light  III,VII: ptosis None  III,IV,VI: extraocular muscles  Full ROM  V: mastication Normal  V: facial light touch sensation  Normal  V,VII: corneal reflex  Present  VII: facial muscle function - upper  Normal  VII: facial muscle function - lower Normal  VIII: hearing Not tested  IX: soft palate elevation  Normal  IX,X: gag reflex Present  XI: trapezius strength  5/5  XI: sternocleidomastoid strength 5/5  XI: neck flexion strength  5/5  XII: tongue strength  Normal    Data Review Lab Results  Component Value Date   WBC 8.3 07/22/2017   HGB 12.6 (L) 07/22/2017   HCT 37.4 (L) 07/22/2017   MCV 84.8 07/22/2017   PLT 260 07/22/2017   Lab Results  Component Value Date   NA 138 07/22/2017   K 4.1 07/22/2017   CL 103 07/22/2017   CO2 26 07/22/2017   BUN 11 07/22/2017   CREATININE 0.85 07/22/2017   GLUCOSE 90 07/22/2017   No results found for: INR, PROTIME  Assessment/Plan:  Estimated body mass index is 24.42 kg/m as calculated from the following:   Height as of 07/22/17:  (1.702 m).   Weight as of 07/22/17: 70.7 kg (155 lb 14.4 oz). Patient admitted for lumbar fusion L4-5. Patient has failed a reasonable attempt at conservative therapy.  I explained the condition and procedure to the patient and answered any questions.  Patient wishes to proceed with procedure as planned. Understands risks/ benefits and typical outcomes of procedure.   Paul Hobbs,Paul Hobbs 08/05/2017 10:32 AM

## 2017-08-05 NOTE — Anesthesia Preprocedure Evaluation (Addendum)
Anesthesia Evaluation  Patient identified by MRN, date of birth, ID band Patient awake    Reviewed: Allergy & Precautions, NPO status , Patient's Chart, lab work & pertinent test results  Airway Mallampati: I  TM Distance: >3 FB Neck ROM: Full    Dental  (+) Teeth Intact, Dental Advisory Given   Pulmonary neg pulmonary ROS,    breath sounds clear to auscultation       Cardiovascular hypertension, Pt. on medications  Rhythm:Regular Rate:Normal     Neuro/Psych  Neuromuscular disease negative psych ROS   GI/Hepatic Neg liver ROS, GERD  ,  Endo/Other  negative endocrine ROS  Renal/GU negative Renal ROS     Musculoskeletal  (+) Arthritis ,   Abdominal Normal abdominal exam  (+)   Peds  Hematology   Anesthesia Other Findings   Reproductive/Obstetrics                            Lab Results  Component Value Date   WBC 8.3 07/22/2017   HGB 12.6 (L) 07/22/2017   HCT 37.4 (L) 07/22/2017   MCV 84.8 07/22/2017   PLT 260 07/22/2017   Lab Results  Component Value Date   CREATININE 0.85 07/22/2017   BUN 11 07/22/2017   NA 138 07/22/2017   K 4.1 07/22/2017   CL 103 07/22/2017   CO2 26 07/22/2017   No results found for: INR, PROTIME  EKG: sinus bradycardia, PAC's noted.   Anesthesia Physical Anesthesia Plan  ASA: II  Anesthesia Plan: General   Post-op Pain Management:    Induction: Intravenous  PONV Risk Score and Plan: 3 and Ondansetron, Dexamethasone and Treatment may vary due to age or medical condition  Airway Management Planned: Oral ETT  Additional Equipment: None  Intra-op Plan:   Post-operative Plan: Extubation in OR  Informed Consent: I have reviewed the patients History and Physical, chart, labs and discussed the procedure including the risks, benefits and alternatives for the proposed anesthesia with the patient or authorized representative who has indicated his/her  understanding and acceptance.   Dental advisory given  Plan Discussed with: CRNA  Anesthesia Plan Comments:        Anesthesia Quick Evaluation

## 2017-08-05 NOTE — Op Note (Signed)
08/05/2017  5:18 PM  PATIENT:  Paul Hobbs  82 y.o. 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 Decompressive lumbar laminectomy L4-5 right requiring more work than would be required for a simple exposure of the disk for TLIF in order to adequately decompress the neural elements and address the spinal stenosis 2. Transforaminal lumbar interbody fusion L4-5 using porous titanium interbody cage packed with morcellized allograft and autograft 3. Posterior fixation L4-5 usingATEC cortical pedicle screws.  4. Intertransverse arthrodesis L4-5 using morcellized autograft and allograft.  SURGEON:  Marikay Alar, MD  ASSISTANTS: Dr. Lovell Sheehan  ANESTHESIA:  General  EBL: 100 ml  Total I/O In: 1700 [I.V.:1700] Out: 330 [Urine:230; Blood:100]  BLOOD ADMINISTERED:none  DRAINS: none   INDICATION FOR PROCEDURE: This patient presented with severe back pain with foot numbness and some right leg pain. Imaging revealed recurrent spinal stenosis L4-5. He had a postlaminectomy spondylolisthesis at this level.. The patient tried a reasonable attempt at conservative medical measures without relief. I recommended decompression and instrumented fusion to address the stenosis as well as the segmental  instability.  Patient understood the risks, benefits, and alternatives and potential outcomes and wished to proceed.  PROCEDURE DETAILS:  The patient was brought to the operating room. After induction of generalized endotracheal anesthesia the patient was rolled into the prone position on chest rolls and all pressure points were padded. The patient's lumbar region was cleaned and then prepped with DuraPrep and draped in the usual sterile fashion. Anesthesia was injected and then a dorsal midline incision was made and carried down to the lumbosacral fascia. The fascia was opened and the paraspinous  musculature was taken down in a subperiosteal fashion to expose L4-5. A self-retaining retractor was placed. Intraoperative fluoroscopy confirmed my level, and I started with placement of the L4 cortical pedicle screws. The pedicle screw entry zones were identified utilizing surface landmarks and  AP and lateral fluoroscopy. I scored the cortex with the high-speed drill and then used the hand drill to drill an upward and outward direction into the pedicle. I then tapped line to line. I then placed a 5.5 x 40 mm cortical pedicle screw into the pedicles of L4 bilaterally. I then turned my attention to the decompression and complete lumbar laminectomy, hemi- facetectomy, and foraminotomy was performed at L4-5 on the right. The patient had significant spinal stenosis and this required more work than would be required for a simple exposure of the disc for transforaminal lumbar interbody fusion which would only require a limited laminotomy. Much more generous decompression and generous foraminotomy was undertaken in order to adequately decompress the neural elements and address the patient's leg pain. The yellow ligament was removed to expose the underlying dura and nerve roots, and generous foraminotomies were performed to adequately decompress the neural elements. Both the exiting and traversing nerve roots were decompressed until a coronary dilator passed easily along the nerve roots. Once the decompression was complete, I turned my attention to the transforaminal lower lumbar interbody fusion. The epidural venous vasculature was coagulated and cut sharply. Disc space was incised and the initial discectomy was performed with pituitary rongeurs. The disc space was distracted with sequential distractors to a height of 10 mm. We then used a series of scrapers and shavers to prepare the endplates for fusion. The midline was prepared with Epstein curettes. Once the complete discectomy was finished, we packed an appropriate  sized  interbody cage with local autograft and morcellized allograft, gently retracted the nerve root, and tapped the cage into position at L4-5.  The midline under the cage was packed with morselized autograft and allograft. We then turned our attention to the placement of the lower pedicle screws. The pedicle screw entry zones were identified utilizing surface landmarks and fluoroscopy. I drilled into each pedicle utilizing the hand drill, and tapped each pedicle with the appropriate tap. We palpated with a ball probe to assure no break in the cortex. We then placed 5.5 x 40 mm cortical pedicle screws into the pedicles bilaterally at L5. We then decorticated the transverse processes and laid a mixture of morcellized autograft and allograft out over these to perform intertransverse arthrodesis at L4-5 bilaterally. We then placed lordotic rods into the multiaxial screw heads of the pedicle screws and locked these in position with the locking caps and anti-torque device. We then checked our construct with AP and lateral fluoroscopy. Irrigated with copious amounts of bacitracin-containing saline solution. Inspected the nerve roots once again to assure adequate decompression, lined to the dura with Gelfoam, placed powdered vancomycin into the wound, and closed the muscle and the fascia with 0 Vicryl. Closed the subcutaneous tissues with 2-0 Vicryl and subcuticular tissues with 3-0 Vicryl. The skin was closed with benzoin and Steri-Strips. Dressing was then applied, the patient was awakened from general anesthesia and transported to the recovery room in stable condition. At the end of the procedure all sponge, needle and instrument counts were correct.   PLAN OF CARE: admit to inpatient  PATIENT DISPOSITION:  PACU - hemodynamically stable.   Delay start of Pharmacological VTE agent (>24hrs) due to surgical blood loss or risk of bleeding:  yes

## 2017-08-05 NOTE — Transfer of Care (Signed)
Immediate Anesthesia Transfer of Care Note  Patient: Paul Hobbs  Procedure(s) Performed: Lumbar four-five Transforaminal Lumbar Interbody fusion, Hemifacetectomy Right Lumbar four-five; Pedicle screw fixation Lumbar four-five (N/A Back)  Patient Location: PACU  Anesthesia Type:General  Level of Consciousness: awake, alert  and oriented  Airway & Oxygen Therapy: Patient Spontanous Breathing and Patient connected to nasal cannula oxygen  Post-op Assessment: Report given to RN and Post -op Vital signs reviewed and stable  Post vital signs: Reviewed and stable  Last Vitals:  Vitals Value Taken Time  BP    Temp    Pulse    Resp    SpO2      Last Pain:  Vitals:   08/05/17 1950  TempSrc:   PainSc: 4       Patients Stated Pain Goal: 3 (08/05/17 1950)  Complications: No apparent anesthesia complications

## 2017-08-06 MED ORDER — ALUM & MAG HYDROXIDE-SIMETH 200-200-20 MG/5ML PO SUSP
30.0000 mL | ORAL | Status: DC | PRN
  Administered 2017-08-06 (×2): 30 mL via ORAL
  Filled 2017-08-06 (×2): qty 30

## 2017-08-06 MED FILL — Thrombin For Soln 5000 Unit: CUTANEOUS | Qty: 5000 | Status: AC

## 2017-08-06 MED FILL — Thrombin For Soln 20000 Unit: CUTANEOUS | Qty: 1 | Status: AC

## 2017-08-06 NOTE — Evaluation (Signed)
Physical Therapy Evaluation & Discharge Patient Details Name: Paul Hobbs MRN: [id number] DOB: 01-28-1936 Today's Date: 08/06/2017   History of Present Illness  Pt is an 82 y/o male s/p TLIF and posterior fixation at L4-L5. PMH including but not limited to HTN, spinal stenosis and hx of prior back surgery in 2017.  Clinical Impression  Pt presented sitting EOB, awake and willing to participate in therapy session. Prior to admission, pt reported that he ambulated with use of SPC and was independent with ADLs. Pt lives alone but has a friend that can provide 24/7 supervision/assistance as needed. Pt currently able to perform transfers with min guard, ambulate in hallway with min guard with 1HHA to simulate use of SPC and ascend/descend a flight of stairs with one hand rail and min guard for safety. PT provided pt with back precautions handout and reviewed with pt throughout. PT discussed generalized walking program with pt to initiate upon d/c. No further acute PT needs identified at this time. PT signing off.     Follow Up Recommendations No PT follow up;Other (comment)(OP PT for balance when cleared by MD)    Equipment Recommendations  None recommended by PT    Recommendations for Other Services       Precautions / Restrictions Precautions Precautions: Back Precaution Booklet Issued: Yes (comment) Precaution Comments: PT reviewed 3/3 back precautions with pt throughout Required Braces or Orthoses: Spinal Brace Spinal Brace: Lumbar corset;Applied in sitting position Restrictions Weight Bearing Restrictions: No      Mobility  Bed Mobility               General bed mobility comments: pt sitting EOB upon arrival  Transfers Overall transfer level: Needs assistance Equipment used: None Transfers: Sit to/from Stand Sit to Stand: Min guard         General transfer comment: min guard for safety, no AD  Ambulation/Gait Ambulation/Gait assistance: Min guard Ambulation  Distance (Feet): 500 Feet Assistive device: 1 person hand held assist Gait Pattern/deviations: Step-through pattern;Decreased stride length;Drifts right/left Gait velocity: decreased Gait velocity interpretation: <1.31 ft/sec, indicative of household ambulator General Gait Details: pt with mild instability with 1HHA, no overt LOB or need for physical assistance, min guard for safety (pt reported chronic balance deficits)  Stairs Stairs: Yes Stairs assistance: Min guard Stair Management: One rail Left;Alternating pattern;Step to pattern;Forwards Number of Stairs: 10 General stair comments: min guard for safety  Wheelchair Mobility    Modified Rankin (Stroke Patients Only)       Balance Overall balance assessment: Needs assistance Sitting-balance support: Feet supported Sitting balance-Leahy Scale: Good     Standing balance support: During functional activity;Single extremity supported;No upper extremity supported Standing balance-Leahy Scale: Fair                               Pertinent Vitals/Pain Pain Assessment: No/denies pain    Home Living Family/patient expects to be discharged to:: Private residence Living Arrangements: Alone Available Help at Discharge: Family;Friend(s);Available 24 hours/day Type of Home: House Home Access: Stairs to enter Entrance Stairs-Rails: Doctor, general practice of Steps: 3 Home Layout: One level Home Equipment: Cane - single point;Shower seat      Prior Function Level of Independence: Independent with assistive device(s)         Comments: was ambulating with SPC     Hand Dominance        Extremity/Trunk Assessment   Upper Extremity Assessment Upper Extremity  Assessment: Defer to OT evaluation    Lower Extremity Assessment Lower Extremity Assessment: Overall WFL for tasks assessed    Cervical / Trunk Assessment Cervical / Trunk Assessment: Other exceptions Cervical / Trunk Exceptions: s/p  lumbar sx  Communication   Communication: No difficulties  Cognition Arousal/Alertness: Awake/alert Behavior During Therapy: WFL for tasks assessed/performed Overall Cognitive Status: Within Functional Limits for tasks assessed                                        General Comments      Exercises     Assessment/Plan    PT Assessment Patent does not need any further PT services  PT Problem List         PT Treatment Interventions      PT Goals (Current goals can be found in the Care Plan section)  Acute Rehab PT Goals Patient Stated Goal: return home    Frequency     Barriers to discharge        Co-evaluation               AM-PAC PT "6 Clicks" Daily Activity  Outcome Measure Difficulty turning over in bed (including adjusting bedclothes, sheets and blankets)?: A Little Difficulty moving from lying on back to sitting on the side of the bed? : A Little Difficulty sitting down on and standing up from a chair with arms (e.g., wheelchair, bedside commode, etc,.)?: A Little Help needed moving to and from a bed to chair (including a wheelchair)?: None Help needed walking in hospital room?: A Little Help needed climbing 3-5 steps with a railing? : A Little 6 Click Score: 19    End of Session Equipment Utilized During Treatment: Back brace Activity Tolerance: Patient tolerated treatment well Patient left: with call bell/phone within reach;Other (comment)(standing at EOB) Nurse Communication: Mobility status PT Visit Diagnosis: Other abnormalities of gait and mobility (R26.89)    Time: 1610-9604 PT Time Calculation (min) (ACUTE ONLY): 20 min   Charges:   PT Evaluation $PT Eval Low Complexity: 1 Low     PT G Codes:        [place], PT, [place] [phone]   Paul Hobbs 08/06/2017, 8:50 AM

## 2017-08-06 NOTE — Evaluation (Signed)
Occupational Therapy Evaluation Patient Details Name: Paul Hobbs MRN: [id number] DOB: 10-12-1935 Today's Date: 08/06/2017    History of Present Illness Pt is an 82 y/o male s/p TLIF and posterior fixation at L4-L5. PMH including but not limited to HTN, spinal stenosis and hx of prior back surgery in 2017.   Clinical Impression   Pt reports he was independent with ADL PTA. Currently pt min assist for LB ADL and min HHA-close min guard for functional mobility. Began back, safety, and ADL education with pt. Pt planning to d/c home with 24/7 supervision from family. Pt would benefit from continued skilled OT to address established goals.    Follow Up Recommendations  No OT follow up;Supervision/Assistance - 24 hour    Equipment Recommendations  3 in 1 bedside commode    Recommendations for Other Services       Precautions / Restrictions Precautions Precautions: Back Precaution Booklet Issued: No Precaution Comments: Reviewed 3/3 back precautions with pt Required Braces or Orthoses: Spinal Brace Spinal Brace: Lumbar corset;Applied in sitting position Restrictions Weight Bearing Restrictions: No      Mobility Bed Mobility               General bed mobility comments: Pt sitting EOB upon arrival  Transfers Overall transfer level: Needs assistance Equipment used: None Transfers: Sit to/from Stand Sit to Stand: Min guard         General transfer comment: Cues for hand placement and technique, min guard for balance and safety    Balance Overall balance assessment: Needs assistance Sitting-balance support: Feet supported Sitting balance-Leahy Scale: Good     Standing balance support: No upper extremity supported;During functional activity;Single extremity supported Standing balance-Leahy Scale: Fair                             ADL either performed or assessed with clinical judgement   ADL Overall ADL's : Needs assistance/impaired Eating/Feeding: Set  up;Sitting   Grooming: Min guard;Standing   Upper Body Bathing: Set up;Supervision/ safety;Sitting   Lower Body Bathing: Minimal assistance;Sit to/from stand   Upper Body Dressing : Set up;Supervision/safety;Sitting   Lower Body Dressing: Minimal assistance;Sit to/from stand Lower Body Dressing Details (indicate cue type and reason): Pt reports he typically dons socks supine in bed; discussed that this may be the best technique for LB dressing as well. Would benefit from practice. Toilet Transfer: Minimal assistance;Min guard;Ambulation Toilet Transfer Details (indicate cue type and reason): Simulated by sit to stand from EOB with functional mobility. HHA provided initially with progression to min guard   Toileting - Clothing Manipulation Details (indicate cue type and reason): Educated pt on peri care technique without twisting or bending. Pt able to return demo simulation Tub/ Shower Transfer: Min guard;Walk-in shower;Ambulation;3 in 1;Grab bars Tub/Shower Transfer Details (indicate cue type and reason): Educated on use of 3 in 1 in shower as a seat. Pt verbalized understanding. Min guard for transfer Functional mobility during ADLs: Minimal assistance;Min guard(HHA initially) General ADL Comments: Educated pt on maintaining back precuations during functional activities, keeping frequently used items at [employer] top height     Vision         Perception     Praxis      Pertinent Vitals/Pain Pain Assessment: No/denies pain     Hand Dominance     Extremity/Trunk Assessment Upper Extremity Assessment Upper Extremity Assessment: LUE deficits/detail LUE Deficits / Details: Hx of shoulder sx; limited shoulder ROM  Lower Extremity Assessment Lower Extremity Assessment: Defer to PT evaluation   Cervical / Trunk Assessment Cervical / Trunk Assessment: Other exceptions Cervical / Trunk Exceptions: s/p lumbar sx   Communication Communication Communication: No difficulties    Cognition Arousal/Alertness: Awake/alert Behavior During Therapy: WFL for tasks assessed/performed Overall Cognitive Status: Within Functional Limits for tasks assessed                                     General Comments       Exercises     Shoulder Instructions      Home Living Family/patient expects to be discharged to:: Private residence Living Arrangements: Alone Available Help at Discharge: Family;Friend(s);Available 24 hours/day Type of Home: House Home Access: Stairs to enter [employer] of Steps: 3 Entrance Stairs-Rails: Right;Left Home Layout: One level     Bathroom Shower/Tub: Producer, television/film/video: Handicapped height     Home Equipment: Cane - single point;Shower seat - built in;Grab bars - tub/shower          Prior Functioning/Environment Level of Independence: Independent with assistive device(s)        Comments: was ambulating with SPC        OT Problem List: Decreased strength;Decreased range of motion;Impaired balance (sitting and/or standing);Decreased knowledge of use of DME or AE;Decreased knowledge of precautions;Impaired UE functional use      OT Treatment/Interventions: Self-care/ADL training;DME and/or AE instruction;Therapeutic activities;Patient/family education;Balance training    OT Goals(Current goals can be found in the care plan section) Acute Rehab OT Goals Patient Stated Goal: return home OT Goal Formulation: With patient Time For Goal Achievement: 08/20/17 Potential to Achieve Goals: Good ADL Goals Pt Will Perform Lower Body Bathing: with modified independence;sit to/from stand;with adaptive equipment Pt Will Perform Lower Body Dressing: with modified independence(in supine at bed level) Additional ADL Goal #1: Pt will don/doff back brace with set up as precursor to functional mobility. Additional ADL Goal #2: Pt will independently verbally recall 3/3 back precautions and maintain  thorughout ADL.  OT Frequency: Min 2X/week   Barriers to D/C:            Co-evaluation              AM-PAC PT "6 Clicks" Daily Activity     Outcome Measure Help from another person eating meals?: A Little Help from another person taking care of personal grooming?: A Little Help from another person toileting, which includes using toliet, bedpan, or urinal?: A Little Help from another person bathing (including washing, rinsing, drying)?: A Little Help from another person to put on and taking off regular upper body clothing?: A Little Help from another person to put on and taking off regular lower body clothing?: A Little 6 Click Score: 18   End of Session Equipment Utilized During Treatment: Back brace Nurse Communication: Mobility status;Other (comment)(equipment and f/u needs)  Activity Tolerance: Patient tolerated treatment well Patient left: with call bell/phone within reach;Other (comment)(sitting EOB)  OT Visit Diagnosis: Unsteadiness on feet (R26.81);Other abnormalities of gait and mobility (R26.89)                Time: 1610-9604 OT Time Calculation (min): 11 min Charges:  OT General Charges $OT Visit: 1 Visit OT Evaluation $OT Eval Moderate Complexity: 1 Mod G-Codes:     Steadman Prosperi A. Brett Albino, M.S., OTR/L Pager: [phone]  Gaye Alken 08/06/2017, 10:15 AM

## 2017-08-06 NOTE — Anesthesia Postprocedure Evaluation (Signed)
Anesthesia Post Note  Patient: KENDYN ZAMAN  Procedure(s) Performed: Lumbar four-five Transforaminal Lumbar Interbody fusion, Hemifacetectomy Right Lumbar four-five; Pedicle screw fixation Lumbar four-five (N/A Back)     Patient location during evaluation: PACU Anesthesia Type: General Level of consciousness: awake and alert Pain management: pain level controlled Vital Signs Assessment: post-procedure vital signs reviewed and stable Respiratory status: spontaneous breathing, nonlabored ventilation, respiratory function stable and patient connected to nasal cannula oxygen Cardiovascular status: blood pressure returned to baseline and stable Postop Assessment: no apparent nausea or vomiting Anesthetic complications: no    Last Vitals:  Vitals:   08/06/17 1212 08/06/17 1556  BP: (!) 152/62 (!) 148/68  Pulse: 63 62  Resp: 16 16  Temp: 36.9 C 36.9 C  SpO2: 100% 100%    Last Pain:  Vitals:   08/06/17 1556  TempSrc: Oral  PainSc:                  Shelton Silvas

## 2017-08-07 ENCOUNTER — Encounter (HOSPITAL_COMMUNITY): Payer: Self-pay | Admitting: Neurological Surgery

## 2017-08-07 MED ORDER — CELECOXIB 200 MG PO CAPS: 200.0000 mg | ORAL_CAPSULE | Freq: Two times a day (BID) | ORAL | 0 refills | Status: AC

## 2017-08-07 NOTE — Discharge Summary (Signed)
Physician Discharge Summary  Patient ID: Paul Hobbs MRN: [id number] DOB/AGE: 1935-04-12 82 y.o.  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 The patient tolerated the procedure well and was taken to the recovery room and then to the floor in stable condition. The hospital course was routine. There were no complications. The wound remained clean dry and intact. Pt had appropriate back soreness. No complaints of leg pain or new N/T/W. The patient remained afebrile with stable vital signs, and tolerated a regular diet. The patient continued to increase activities, and pain was well controlled with oral pain medications.   Consults: None  Significant Diagnostic Studies:  Results for orders placed or performed during the hospital encounter of 07/22/17  Surgical pcr screen  Result Value Ref Range   MRSA, PCR NEGATIVE NEGATIVE   Staphylococcus aureus NEGATIVE NEGATIVE  CBC  Result Value Ref Range   WBC 8.3 4.0 - 10.5 K/uL   RBC 4.41 4.22 - 5.81 MIL/uL   Hemoglobin 12.6 (L) 13.0 - 17.0 g/dL   HCT 40.9 (L) 81.1 - 91.4 %   MCV 84.8 78.0 - 100.0 fL   MCH 28.6 26.0 - 34.0 pg   MCHC 33.7 30.0 - 36.0 g/dL   RDW 78.2 95.6 - 21.3 %   Platelets 260 150 - 400 K/uL  Basic metabolic panel  Result Value Ref Range   Sodium 138 135 - 145 mmol/L   Potassium 4.1 3.5 - 5.1 mmol/L   Chloride 103 101 - 111 mmol/L   CO2 26 22 - 32 mmol/L   Glucose, Bld 90 65 - 99 mg/dL   BUN 11 6 - 20 mg/dL   Creatinine, Ser 0.86 0.61 - 1.24 mg/dL   Calcium 9.1 8.9 - 57.8 mg/dL   GFR calc non Af Amer >60 >60 mL/min   GFR calc Af Amer >60 >60 mL/min   Anion gap 9 5 - 15  Type and screen  Result Value Ref Range   ABO/RH(D) O POS     Antibody Screen NEG    Sample Expiration 08/05/2017    Extend sample reason      NO TRANSFUSIONS OR PREGNANCY IN THE PAST 3 MONTHS Performed at [facility], [address] [place], [place] [address]   ABO/Rh  Result Value Ref Range   ABO/RH(D)      O POS Performed at [facility], [address], [place], [place] [address]     Dg Lumbar Spine 2-3 Views  Result Date: 08/05/2017 CLINICAL DATA:  Back pain. EXAM: DG C-ARM 61-120 MIN; LUMBAR SPINE - 2-3 VIEW COMPARISON:  Multiple priors. FINDINGS: Intraoperative radiographs demonstrate L4-5 PLIF. IMPRESSION: As above. Electronically Signed   By: Elsie Stain M.D.   On: 08/05/2017 19:22   Dg C-arm 1-60 Min  Result Date: 08/05/2017 CLINICAL DATA:  Back pain. EXAM: DG C-ARM 61-120 MIN; LUMBAR SPINE - 2-3 VIEW COMPARISON:  Multiple priors. FINDINGS: Intraoperative radiographs demonstrate L4-5 PLIF. IMPRESSION: As above. Electronically Signed   By: Elsie Stain M.D.   On: 08/05/2017 19:22   Dg Or Local Abdomen  Result Date: 08/05/2017 CLINICAL DATA:  82 y/o  M; lumbar fusion with missing sponge. EXAM: OR LOCAL ABDOMEN COMPARISON:  None. FINDINGS: Posterior interbody lumbar fusion postsurgical changes. Normal  bowel gas pattern. No radiopaque foreign body identified. IMPRESSION: No radiopaque foreign body identified. These results were called by telephone at the time of interpretation on 08/05/2017 at 5:25 pm to OR 18. Electronically Signed   By: Mitzi Hansen M.D.   On: 08/05/2017 17:28    Antibiotics:  Anti-infectives (From admission, onward)   Start     Dose/Rate Route Frequency Ordered Stop   08/06/17 0030  vancomycin (VANCOCIN) IVPB 750 mg/150 ml premix     750 mg 150 mL/hr over 60 Minutes Intravenous  Once 08/05/17 1953 08/06/17 0100   08/05/17 1614  vancomycin (VANCOCIN) powder  Status:  Discontinued       As needed 08/05/17 1614 08/05/17 1732   08/05/17 1611  bacitracin 50,000 Units in sodium chloride 0.9 % 500 mL  irrigation  Status:  Discontinued       As needed 08/05/17 1611 08/05/17 1732   08/05/17 1106  vancomycin (VANCOCIN) IVPB 1000 mg/200 mL premix     1,000 mg 200 mL/hr over 60 Minutes Intravenous On call to O.R. 08/05/17 1106 08/05/17 1251      Discharge Exam: Blood pressure (!) 160/63, pulse (!) 57, temperature 97.6 F (36.4 C), temperature source Oral, resp. rate 16, height  (1.702 m), weight 145 lb (65.8 kg), SpO2 99 %. Neurologic: Grossly normal Ambulating and voiding well  Discharge Medications:   Allergies as of 08/07/2017      Reactions   Ezetimibe Other (See Comments)   Myalgias   Gemfibrozil Other (See Comments)   Myalgias   Lovastatin Other (See Comments)   Myalgias   Penicillins Swelling, Other (See Comments)   PATIENT HAS HAD A PCN REACTION WITH IMMEDIATE RASH, FACIAL/TONGUE/THROAT SWELLING, SOB, OR LIGHTHEADEDNESS WITH HYPOTENSION:  #  #  YES  #  #  Has patient had a PCN reaction causing severe rash involving mucus membranes or skin necrosis: No Has patient had a PCN reaction that required hospitalization No Has patient had a PCN reaction occurring within the last 10 years: No If all of the above answers are "NO", then may proceed with Cephalosporin use.   Hydrocodone Other (See Comments)   HALLUCINATIONS   Morphine And Related Other (See Comments)   ALL OPIOIDS Pt reports Halluncinations      Medication List    TAKE these medications   acetaminophen 325 MG tablet Commonly known as:  TYLENOL Take 650 mg by mouth every 6 (six) hours as needed for mild pain.   amLODipine 5 MG tablet Commonly known as:  NORVASC Take 5 mg by mouth 2 (two) times daily.   aspirin EC 81 MG tablet Take 81 mg by mouth daily.   atorvastatin 10 MG tablet Commonly known as:  LIPITOR Take 10 mg by mouth every evening.   celecoxib 200 MG capsule Commonly known as:  CELEBREX Take 1 capsule (200 mg total) by mouth every 12 (twelve) hours.   enalapril 20 MG tablet Commonly  known as:  VASOTEC Take 20 mg by mouth 2 (two) times daily.   finasteride 5 MG tablet Commonly known as:  PROSCAR Take 5 mg by mouth daily.   Magnesium 250 MG Tabs Take 250 mg by mouth daily.   multivitamin with minerals Tabs tablet Take 1 tablet by mouth daily.   omega-3 acid ethyl esters 1 g capsule Commonly known as:  LOVAZA Take 1 g by mouth 2 (two) times daily.   timolol 0.5 % ophthalmic solution Commonly known as:  TIMOPTIC Place 1 drop  into both eyes 2 (two) times daily.            Durable Medical Equipment  (From admission, onward)        Start     Ordered   08/05/17 1941  DME Walker rolling  Once    Question:  Patient needs a walker to treat with the following condition  Answer:  S/P lumbar fusion   08/05/17 1940   08/05/17 1941  DME 3 n 1  Once     08/05/17 1940      Disposition: plif l4-5   Final Dx: home  Discharge Instructions    Call MD for:  difficulty breathing, headache or visual disturbances   Complete by:  As directed    Call MD for:  extreme fatigue   Complete by:  As directed    Call MD for:  hives   Complete by:  As directed    Call MD for:  persistant dizziness or light-headedness   Complete by:  As directed    Call MD for:  persistant nausea and vomiting   Complete by:  As directed    Call MD for:  redness, tenderness, or signs of infection (pain, swelling, redness, odor or green/yellow discharge around incision site)   Complete by:  As directed    Call MD for:  severe uncontrolled pain   Complete by:  As directed    Call MD for:  temperature >100.4   Complete by:  As directed    Diet - low sodium heart healthy   Complete by:  As directed    Driving Restrictions   Complete by:  As directed    No driving 2 weeks   Increase activity slowly   Complete by:  As directed    Lifting restrictions   Complete by:  As directed    Nothing heavier than 8 lbs   Remove dressing in 48 hours   Complete by:  As directed           Signed: Tiana Loft Meyran 08/07/2017, 11:03 AM

## 2017-08-07 NOTE — Progress Notes (Signed)
Patient alert and oriented, mae's well, voiding adequate amount of urine, swallowing without difficulty, no c/o pain at time of discharge. Patient discharged home with family. Script and discharged instructions given to patient. Patient and family stated understanding of instructions given. Patient has an appointment with Dr. Jones °

## 2017-08-07 NOTE — Progress Notes (Signed)
Occupational Therapy Treatment and Discharge Patient Details Name: Paul Hobbs MRN: [id number] DOB: [birth year] Today's Date: 08/07/2017    History of present illness Pt is an 82 y/o male s/p TLIF and posterior fixation at L4-L5. PMH including but not limited to HTN, spinal stenosis and hx of prior back surgery in 2017.   OT comments  This 82 yo male admitted and underwent above presents to acute OT with all education completed with basic ADLs and no further questions from pt. We will D/C from acute OT.  Follow Up Recommendations  No OT follow up;Supervision - Intermittent    Equipment Recommendations  3 in 1 bedside commode       Precautions / Restrictions Precautions Precautions: Back Precaution Comments: Reviewed 3/3 back precautions with pt Required Braces or Orthoses: Spinal Brace Spinal Brace: Lumbar corset;Applied in sitting position Restrictions Weight Bearing Restrictions: No       Mobility Bed Mobility               General bed mobility comments: Pt up walking in hallway upon arrival  Transfers Overall transfer level: Needs assistance Equipment used: None Transfers: Sit to/from Stand Sit to Stand: Supervision         General transfer comment: Cues for hand placement and technique        ADL either performed or assessed with clinical judgement   ADL Overall ADL's : Needs assistance/impaired                                       General ADL Comments: Issued pt black elastic laces and pt able to don his shoes without need for tying them. Educated pt on use of wet wipes for back peri care post bowel movement, use of 2 cups for brushing teeth, sequence of getting dressed. Pt able to doff and don is brace independently     Vision Patient Visual Report: No change from baseline            Cognition Arousal/Alertness: Awake/alert Behavior During Therapy: WFL for tasks assessed/performed Overall Cognitive Status: Within Functional  Limits for tasks assessed                                                     Pertinent Vitals/ Pain       Pain Assessment: 0-10 Pain Score: 2  Pain Location: back Pain Descriptors / Indicators: Aching Pain Intervention(s): Monitored during session;Limited activity within patient's tolerance         Frequency  Min 2X/week        Progress Toward Goals  OT Goals(current goals can now be found in the care plan section)  Progress towards OT goals: (All education completed)     Plan Discharge plan remains appropriate       AM-PAC PT "6 Clicks" Daily Activity     Outcome Measure   Help from another person eating meals?: None Help from another person taking care of personal grooming?: None Help from another person toileting, which includes using toliet, bedpan, or urinal?: None Help from another person bathing (including washing, rinsing, drying)?: None Help from another person to put on and taking off regular upper body clothing?: None Help from another person to put on and taking off  regular lower body clothing?: None 6 Click Score: 24    End of Session Equipment Utilized During Treatment: Back brace  OT Visit Diagnosis: Unsteadiness on feet (R26.81)   Activity Tolerance Patient tolerated treatment well   Patient Left (walking in hallway)   Nurse Communication (pt ready to go from therapy standpoint)        Time: 1610-9604 OT Time Calculation (min): 22 min  Charges: OT General Charges $OT Visit: 1 Visit OT Treatments $Self Care/Home Management : 8-22 mins  Ignacia Palma, OTR/L [phone] 08/07/2017

## 2018-01-29 ENCOUNTER — Other Ambulatory Visit: Payer: Self-pay | Admitting: Internal Medicine

## 2018-01-29 DIAGNOSIS — R911 Solitary pulmonary nodule: Secondary | ICD-10-CM

## 2018-02-03 ENCOUNTER — Ambulatory Visit
Admission: RE | Admit: 2018-02-03 | Discharge: 2018-02-03 | Disposition: A | Discharge status: Home / Self Care | Payer: Medicare HMO | Source: Ambulatory Visit | Attending: Internal Medicine | Admitting: Internal Medicine

## 2018-02-03 DIAGNOSIS — R911 Solitary pulmonary nodule: Secondary | ICD-10-CM | POA: Diagnosis not present

## 2018-02-03 DIAGNOSIS — I700 Atherosclerosis of aorta: Secondary | ICD-10-CM | POA: Insufficient documentation

## 2018-02-03 DIAGNOSIS — J984 Other disorders of lung: Secondary | ICD-10-CM | POA: Insufficient documentation

## 2018-02-03 DIAGNOSIS — I2510 Atherosclerotic heart disease of native coronary artery without angina pectoris: Secondary | ICD-10-CM | POA: Diagnosis not present

## 2018-02-03 DIAGNOSIS — N29 Other disorders of kidney and ureter in diseases classified elsewhere: Secondary | ICD-10-CM | POA: Diagnosis not present

## 2018-11-23 ENCOUNTER — Other Ambulatory Visit: Payer: Self-pay

## 2018-11-23 ENCOUNTER — Emergency Department
Admission: EM | Admit: 2018-11-23 | Discharge: 2018-11-23 | Disposition: A | Discharge status: Home / Self Care | Payer: Medicare HMO | Attending: Emergency Medicine | Admitting: Emergency Medicine

## 2018-11-23 ENCOUNTER — Emergency Department: Payer: Medicare HMO

## 2018-11-23 DIAGNOSIS — G8929 Other chronic pain: Secondary | ICD-10-CM

## 2018-11-23 DIAGNOSIS — I10 Essential (primary) hypertension: Secondary | ICD-10-CM | POA: Diagnosis not present

## 2018-11-23 DIAGNOSIS — M545 Low back pain: Secondary | ICD-10-CM | POA: Diagnosis present

## 2018-11-23 DIAGNOSIS — M5441 Lumbago with sciatica, right side: Secondary | ICD-10-CM | POA: Insufficient documentation

## 2018-11-23 DIAGNOSIS — Z79899 Other long term (current) drug therapy: Secondary | ICD-10-CM | POA: Diagnosis not present

## 2018-11-23 DIAGNOSIS — Z7982 Long term (current) use of aspirin: Secondary | ICD-10-CM | POA: Insufficient documentation

## 2018-11-23 LAB — CBC WITH DIFFERENTIAL/PLATELET
Abs Immature Granulocytes: 0.02 10*3/uL (ref 0.00–0.07)
Basophils Absolute: 0.1 10*3/uL (ref 0.0–0.1)
Basophils Relative: 2 %
Eosinophils Absolute: 0.1 10*3/uL (ref 0.0–0.5)
Eosinophils Relative: 2 %
HCT: 40.9 % (ref 39.0–52.0)
Hemoglobin: 14 g/dL (ref 13.0–17.0)
Immature Granulocytes: 0 %
Lymphocytes Relative: 29 %
Lymphs Abs: 1.8 10*3/uL (ref 0.7–4.0)
MCH: 29.3 pg (ref 26.0–34.0)
MCHC: 34.2 g/dL (ref 30.0–36.0)
MCV: 85.6 fL (ref 80.0–100.0)
Monocytes Absolute: 0.5 10*3/uL (ref 0.1–1.0)
Monocytes Relative: 8 %
Neutro Abs: 3.7 10*3/uL (ref 1.7–7.7)
Neutrophils Relative %: 59 %
Platelets: 253 10*3/uL (ref 150–400)
RBC: 4.78 MIL/uL (ref 4.22–5.81)
RDW: 13.4 % (ref 11.5–15.5)
WBC: 6.3 10*3/uL (ref 4.0–10.5)
nRBC: 0 % (ref 0.0–0.2)

## 2018-11-23 LAB — BASIC METABOLIC PANEL
Anion gap: 9 (ref 5–15)
BUN: 15 mg/dL (ref 8–23)
CO2: 26 mmol/L (ref 22–32)
Calcium: 9.1 mg/dL (ref 8.9–10.3)
Chloride: 104 mmol/L (ref 98–111)
Creatinine, Ser: 0.76 mg/dL (ref 0.61–1.24)
GFR calc Af Amer: >60 mL/min (ref >60)
GFR calc non Af Amer: >60 mL/min (ref >60)
Glucose, Bld: 121 mg/dL — ABNORMAL HIGH (ref 70–99)
Potassium: 3.4 mmol/L — ABNORMAL LOW (ref 3.5–5.1)
Sodium: 139 mmol/L (ref 135–145)

## 2018-11-23 MED ORDER — LIDOCAINE 5 % EX PTCH: 1.0000 | MEDICATED_PATCH | Freq: Two times a day (BID) | CUTANEOUS | 0 refills | Status: AC

## 2018-11-23 MED ORDER — LIDOCAINE 5 % EX PTCH
1.0000 | MEDICATED_PATCH | CUTANEOUS | Status: DC
  Administered 2018-11-23: 1 via TRANSDERMAL
  Filled 2018-11-23: qty 1

## 2018-11-23 MED ORDER — FENTANYL CITRATE (PF) 100 MCG/2ML IJ SOLN
50.0000 ug | Freq: Once | INTRAMUSCULAR | Status: AC
  Administered 2018-11-23: 50 ug via INTRAVENOUS
  Filled 2018-11-23: qty 2

## 2018-11-23 MED ORDER — OXYCODONE-ACETAMINOPHEN 5-325 MG PO TABS
1.0000 | ORAL_TABLET | Freq: Once | ORAL | Status: AC
  Administered 2018-11-23: 08:00:00 1 via ORAL
  Filled 2018-11-23: qty 1

## 2018-11-23 MED ORDER — OXYCODONE-ACETAMINOPHEN 5-325 MG PO TABS: 1.0000 | ORAL_TABLET | Freq: Four times a day (QID) | ORAL | 0 refills | Status: AC | PRN

## 2018-11-23 MED ORDER — MORPHINE SULFATE (PF) 4 MG/ML IV SOLN
4.0000 mg | Freq: Once | INTRAVENOUS | Status: AC
  Administered 2018-11-23: 4 mg via INTRAVENOUS
  Filled 2018-11-23: qty 1

## 2018-11-23 NOTE — ED Notes (Signed)
Pt returned from CT via stretcher.

## 2018-11-23 NOTE — ED Notes (Signed)
EDP at bedside  

## 2018-11-23 NOTE — ED Provider Notes (Addendum)
[facility] Emergency Department Provider Note  ____________________________________________  Time seen: Approximately 6:42 AM  I have reviewed the triage vital signs and the nursing notes.   HISTORY  Chief Complaint Back Pain    HPI Paul Hobbs is a 83 y.o. male with a history of GERD hypertension and spinal stenosis status post L4-L5 fusion who comes the ED complaining of low back pain for the past 3 weeks, worse on the right, radiates to right upper thigh.  Not associated with weakness or paresthesias.  No fevers chills or body aches.  Symptoms are waxing and waning for the past 3weeks worsening for the past few days.   No bowel or bladder incontinence or retention.  No saddle anesthesia.  No leg weakness or tingling   Past Medical History:  Diagnosis Date  . Arthritis   . Back pain   . Bulging lumbar disc   . GERD (gastroesophageal reflux disease)   . Humerus fracture   . Hypertension   . Sciatica   . Spinal stenosis   . V tach [facility](HCC)      Patient Active Problem List   Diagnosis Date Noted  . S/P lumbar spinal fusion 08/05/2017     Past Surgical History:  Procedure Laterality Date  . BACK SURGERY  02/13/2016  . BUTTOCK MASS EXCISION    . CATARACT EXTRACTION W/ INTRAOCULAR LENS  IMPLANT, BILATERAL     LASER PRIOR   . GROIN EXPLORATION     RIGHT  . SKIN CANCERS     MULTIPLE  . TRANSFORAMINAL LUMBAR INTERBODY FUSION (TLIF) WITH PEDICLE SCREW FIXATION 1 LEVEL N/A 08/05/2017   Procedure: Lumbar four-five Transforaminal Lumbar Interbody fusion, Hemifacetectomy Right Lumbar four-five; Pedicle screw fixation Lumbar four-five;  Surgeon: Tia AlertJones, Harrington S, MD;  [facility];  Service: Neurosurgery;  Laterality: N/A;     Prior to Admission medications   Medication Sig Start Date End Date Taking? Authorizing Provider  acetaminophen (TYLENOL) 325 MG tablet Take 650 mg by mouth every 6 (six) hours as needed for mild pain.    [provider]  amLODipine (NORVASC) 5 MG tablet Take 5 mg by mouth 2 (two) times daily.     [provider]  aspirin EC 81 MG tablet Take 81 mg by mouth daily.    [provider]  atorvastatin (LIPITOR) 10 MG tablet Take 10 mg by mouth every evening.  07/13/15   [provider]  celecoxib (CELEBREX) 200 MG capsule Take 1 capsule (200 mg total) by mouth every 12 (twelve) hours. 08/07/17   Meyran, Tiana LoftKimberly Hannah, NP  enalapril (VASOTEC) 20 MG tablet Take 20 mg by mouth 2 (two) times daily.    [provider]  finasteride (PROSCAR) 5 MG tablet Take 5 mg by mouth daily.    [provider]  Magnesium 250 MG TABS Take 250 mg by mouth daily.    [provider]  Multiple Vitamin (MULTIVITAMIN WITH MINERALS) TABS tablet Take 1 tablet by mouth daily.    [provider]  omega-3 acid ethyl esters (LOVAZA) 1 g capsule Take 1 g by mouth 2 (two) times daily.    [provider]  timolol (TIMOPTIC) 0.5 % ophthalmic solution Place 1 drop into both eyes 2 (two) times daily.    [provider]     Allergies Ezetimibe, Gemfibrozil, Lovastatin, Penicillins, Hydrocodone, and Morphine and related   History reviewed. No pertinent family history.  Social History Social History   Tobacco Use  .  Smoking status: Never Smoker  . Smokeless tobacco: Never Used  Substance Use Topics  . Alcohol use: Yes    Comment: 1-2 NIGHT  . Drug use: No    Review of Systems  Constitutional:   No fever or chills.  ENT:   No sore throat. No rhinorrhea. Cardiovascular:   No chest pain or syncope. Respiratory:   No dyspnea or cough. Gastrointestinal:   Negative for abdominal pain, vomiting and diarrhea.  Musculoskeletal: Low back pain as above All other systems reviewed and are negative except as documented above in ROS and HPI.  ____________________________________________   PHYSICAL EXAM:  VITAL SIGNS: ED Triage Vitals  Enc Vitals Group     BP  11/23/18 0635 (!) 151/105     Pulse Rate 11/23/18 0635 (!) 59     Resp 11/23/18 0635 (!) 22     Temp 11/23/18 0635 98.6 F (37 C)     Temp Source 11/23/18 0635 Oral     SpO2 11/23/18 0635 99 %     Weight 11/23/18 0639 150 lb (68 kg)     Height 11/23/18 0639 5\' 7"  (1.702 m)     Head Circumference --      Peak Flow --      Pain Score 11/23/18 0638 3     Pain Loc --      Pain Edu? --      Excl. in GC? --     Vital signs reviewed, nursing assessments reviewed.   Constitutional:   Alert and oriented. Non-toxic appearance. Eyes:   Conjunctivae are normal. EOMI. PERRL. ENT      Head:   Normocephalic and atraumatic.          Neck:   No meningismus. Full ROM. Hematological/Lymphatic/Immunilogical:   No cervical lymphadenopathy. Cardiovascular:   RRR. Symmetric bilateral radial and DP pulses.  No murmurs. Cap refill less than 2 seconds. Respiratory:   Normal respiratory effort without tachypnea/retractions. Breath sounds are clear and equal bilaterally. No wheezes/rales/rhonchi. Gastrointestinal:   Soft and nontender. Non distended. There is no CVA tenderness.  No rebound, rigidity, or guarding.  Musculoskeletal:   Normal range of motion in all extremities. No joint effusions.  No lower extremity tenderness.  No edema.  Midline spinal tenderness in the area of L5.  Straight leg raise negative on the left Straight leg raise positive at 30 degrees on the right. Neurologic:   Normal speech and language.  Motor grossly intact. No acute focal neurologic deficits are appreciated.  Skin:    Skin is warm, dry and intact. No rash noted.  No petechiae, purpura, or bullae.  ____________________________________________    LABS (pertinent positives/negatives) (all labs ordered are listed, but only abnormal results are displayed) Labs Reviewed  URINE CULTURE  BASIC METABOLIC PANEL  CBC WITH DIFFERENTIAL/PLATELET  URINALYSIS, COMPLETE (UACMP) WITH MICROSCOPIC    ____________________________________________   EKG  Interpreted by me Sinus rhythm rate of 58, normal axis intervals.  Normal QRS ST segments and T waves.  ____________________________________________    RADIOLOGY  No results found.  ____________________________________________   PROCEDURES Procedures  ____________________________________________  DIFFERENTIAL DIAGNOSIS   Compression fracture, spinal hardware disruption, UTI  CLINICAL IMPRESSION / ASSESSMENT AND PLAN / ED COURSE  Medications ordered in the ED: Medications  fentaNYL (SUBLIMAZE) injection 50 mcg (has no administration in time range)    Pertinent labs & imaging results that were available during my care of the patient were reviewed by me and considered in my medical decision making (  see chart for details).  Paul Hobbs was evaluated in Emergency Department on 11/23/2018 for the symptoms described in the history of present illness. He was evaluated in the context of the global COVID-19 pandemic, which necessitated consideration that the patient might be at risk for infection with the SARS-CoV-2 virus that causes COVID-19. Institutional protocols and algorithms that pertain to the evaluation of patients at risk for COVID-19 are in a state of rapid change based on information released by regulatory bodies including the CDC and federal and state organizations. These policies and algorithms were followed during the patient's care in the ED.   Patient presents with low back pain, consistent with sciatica.  Doubt discitis or osteomyelitis.  No signs cauda equina syndrome/central cord compression.  Exam is consistent with right-sided disc herniation.  Due to his age and comorbidities I will get a CT scan to further evaluate.  Check labs.  Fentanyl 50 mcg IV.  Patient reports that he has an appointment at 9:30 AM today with his neurosurgeon, Cleston Lautner but will likely reschedule it to Thursday.  Patient had a CT  scan 1 year ago of the chest that demonstrates the absence of a AAA.  I doubt mesenteric ischemia or AAA or dissection presently.  Care will be signed out to Dr. Charna Archer at 7:00 AM.      ____________________________________________   FINAL CLINICAL IMPRESSION(S) / ED DIAGNOSES    Final diagnoses:  Chronic right-sided low back pain with right-sided sciatica     ED Discharge Orders    None      Portions of this note were generated with dragon dictation software. Dictation errors may occur despite best attempts at proofreading.   Carrie Mew, MD 11/23/18 5638    Carrie Mew, MD 11/23/18 [phone]

## 2018-11-23 NOTE — ED Provider Notes (Signed)
-----------------------------------------   7:08 AM on 11/23/2018 -----------------------------------------  Blood pressure (!) 151/105, pulse (!) 59, temperature 98.6 F (37 C), temperature source Oral, resp. rate (!) 22, height 5\' 7"  (1.702 m), weight 68 kg, SpO2 99 %.  Assuming care from Dr. Joni Fears.  In short, Paul Hobbs is a 83 y.o. male with a chief complaint of Back Pain .  Refer to the original H&P for additional details.  The current plan of care is to reevaluate following CT lumbar spine results. Patient with a history of lumbar spinal stenosis s/p L4-L5 fusion now with worsening pain for the past 3 weeks.  CT scan unremarkable, shows appropriate positioning of hardware with no significant central canal stenosis.  Patient symptoms appear consistent with sciatica, emphasized need to follow-up with his neurosurgeon and return to the ED for new or worsening symptoms.    Blake Divine, MD 11/23/18 [phone]

## 2018-11-23 NOTE — ED Triage Notes (Signed)
Patient states that he has lower back that has persisted for 3 weeks. He states that taking ibuprofen is not affording him any relief. Patient has a history of back surgery.

## 2018-11-26 ENCOUNTER — Other Ambulatory Visit: Payer: Self-pay | Admitting: Student

## 2018-11-26 DIAGNOSIS — M5416 Radiculopathy, lumbar region: Secondary | ICD-10-CM

## 2018-12-09 ENCOUNTER — Ambulatory Visit
Admission: RE | Admit: 2018-12-09 | Discharge: 2018-12-09 | Disposition: A | Discharge status: Home / Self Care | Payer: Medicare HMO | Source: Ambulatory Visit | Attending: Student | Admitting: Student

## 2018-12-09 ENCOUNTER — Other Ambulatory Visit: Payer: Self-pay

## 2018-12-09 DIAGNOSIS — M5416 Radiculopathy, lumbar region: Secondary | ICD-10-CM | POA: Diagnosis not present

## 2018-12-09 MED ORDER — GADOBUTROL 1 MMOL/ML IV SOLN
6.0000 mL | Freq: Once | INTRAVENOUS | Status: AC | PRN
  Administered 2018-12-09: 14:00:00 6 mL via INTRAVENOUS

## 2019-03-10 ENCOUNTER — Other Ambulatory Visit: Payer: Self-pay | Admitting: Neurological Surgery

## 2019-04-04 ENCOUNTER — Other Ambulatory Visit: Payer: Self-pay

## 2019-04-04 ENCOUNTER — Encounter (HOSPITAL_COMMUNITY)
Admission: RE | Admit: 2019-04-04 | Discharge: 2019-04-04 | Disposition: A | Discharge status: Home / Self Care | Payer: Medicare HMO | Source: Ambulatory Visit | Attending: Neurological Surgery | Admitting: Neurological Surgery

## 2019-04-04 ENCOUNTER — Encounter (HOSPITAL_COMMUNITY): Payer: Self-pay

## 2019-04-04 ENCOUNTER — Other Ambulatory Visit
Admission: RE | Admit: 2019-04-04 | Discharge: 2019-04-04 | Disposition: A | Discharge status: Home / Self Care | Payer: Medicare HMO | Source: Ambulatory Visit | Attending: Neurological Surgery | Admitting: Neurological Surgery

## 2019-04-04 ENCOUNTER — Ambulatory Visit (HOSPITAL_COMMUNITY)
Admission: RE | Admit: 2019-04-04 | Discharge: 2019-04-04 | Disposition: A | Discharge status: Home / Self Care | Payer: Medicare HMO | Source: Ambulatory Visit | Attending: Neurological Surgery | Admitting: Neurological Surgery

## 2019-04-04 DIAGNOSIS — Z9842 Cataract extraction status, left eye: Secondary | ICD-10-CM | POA: Insufficient documentation

## 2019-04-04 DIAGNOSIS — K219 Gastro-esophageal reflux disease without esophagitis: Secondary | ICD-10-CM | POA: Diagnosis not present

## 2019-04-04 DIAGNOSIS — Z9841 Cataract extraction status, right eye: Secondary | ICD-10-CM | POA: Diagnosis not present

## 2019-04-04 DIAGNOSIS — Z7982 Long term (current) use of aspirin: Secondary | ICD-10-CM | POA: Diagnosis not present

## 2019-04-04 DIAGNOSIS — Z79899 Other long term (current) drug therapy: Secondary | ICD-10-CM | POA: Diagnosis not present

## 2019-04-04 DIAGNOSIS — Z01812 Encounter for preprocedural laboratory examination: Secondary | ICD-10-CM | POA: Diagnosis present

## 2019-04-04 DIAGNOSIS — M5126 Other intervertebral disc displacement, lumbar region: Secondary | ICD-10-CM

## 2019-04-04 DIAGNOSIS — Z981 Arthrodesis status: Secondary | ICD-10-CM | POA: Insufficient documentation

## 2019-04-04 DIAGNOSIS — H409 Unspecified glaucoma: Secondary | ICD-10-CM | POA: Insufficient documentation

## 2019-04-04 DIAGNOSIS — Z01818 Encounter for other preprocedural examination: Secondary | ICD-10-CM | POA: Insufficient documentation

## 2019-04-04 DIAGNOSIS — I10 Essential (primary) hypertension: Secondary | ICD-10-CM | POA: Insufficient documentation

## 2019-04-04 DIAGNOSIS — Z20822 Contact with and (suspected) exposure to covid-19: Secondary | ICD-10-CM | POA: Diagnosis not present

## 2019-04-04 HISTORY — DX: Unspecified glaucoma: H40.9

## 2019-04-04 HISTORY — DX: Pneumonia, unspecified organism: J18.9

## 2019-04-04 LAB — PROTIME-INR
INR: 1 (ref 0.8–1.2)
Prothrombin Time: 12.8 seconds (ref 11.4–15.2)

## 2019-04-04 LAB — CBC WITH DIFFERENTIAL/PLATELET
Abs Immature Granulocytes: 0.03 10*3/uL (ref 0.00–0.07)
Basophils Absolute: 0.1 10*3/uL (ref 0.0–0.1)
Basophils Relative: 1 %
Eosinophils Absolute: 0.1 10*3/uL (ref 0.0–0.5)
Eosinophils Relative: 1 %
HCT: 40 % (ref 39.0–52.0)
Hemoglobin: 13.4 g/dL (ref 13.0–17.0)
Immature Granulocytes: 0 %
Lymphocytes Relative: 22 %
Lymphs Abs: 2 10*3/uL (ref 0.7–4.0)
MCH: 29.9 pg (ref 26.0–34.0)
MCHC: 33.5 g/dL (ref 30.0–36.0)
MCV: 89.3 fL (ref 80.0–100.0)
Monocytes Absolute: 0.6 10*3/uL (ref 0.1–1.0)
Monocytes Relative: 7 %
Neutro Abs: 6.1 10*3/uL (ref 1.7–7.7)
Neutrophils Relative %: 69 %
Platelets: 266 10*3/uL (ref 150–400)
RBC: 4.48 MIL/uL (ref 4.22–5.81)
RDW: 12.7 % (ref 11.5–15.5)
WBC: 8.9 10*3/uL (ref 4.0–10.5)
nRBC: 0 % (ref 0.0–0.2)

## 2019-04-04 LAB — SURGICAL PCR SCREEN
MRSA, PCR: NEGATIVE
Staphylococcus aureus: NEGATIVE

## 2019-04-04 LAB — SARS CORONAVIRUS 2 (TAT 6-24 HRS): SARS Coronavirus 2: NEGATIVE

## 2019-04-04 LAB — BASIC METABOLIC PANEL
Anion gap: 11 (ref 5–15)
BUN: 8 mg/dL (ref 8–23)
CO2: 21 mmol/L — ABNORMAL LOW (ref 22–32)
Calcium: 9 mg/dL (ref 8.9–10.3)
Chloride: 105 mmol/L (ref 98–111)
Creatinine, Ser: 0.7 mg/dL (ref 0.61–1.24)
GFR calc Af Amer: >60 mL/min (ref >60)
GFR calc non Af Amer: >60 mL/min (ref >60)
Glucose, Bld: 105 mg/dL — ABNORMAL HIGH (ref 70–99)
Potassium: 3.8 mmol/L (ref 3.5–5.1)
Sodium: 137 mmol/L (ref 135–145)

## 2019-04-04 NOTE — Pre-Procedure Instructions (Signed)
Paul Hobbs  04/04/2019    Your procedure is scheduled on  April 06, 2019  Report to [facility], Main Entrance or Entrance "A" at  9:10 AM                     Your surgery or procedure is scheduled for 11:10 A.M.   Call this number if you have problems the morning of surgery: [phone]  This is the number for the Pre- Surgical Desk.                    For any other questions, please call [phone], Monday - Friday 8 AM - 4 PM.   Remember:  Do not eat or drink after midnight Tuesday, January 5.              Take these medicines the morning of surgery with A SIP OF WATER: amLODipine (NORVASC) timolol (TIMOPTIC)   Take if needed:  oxyCODONE-acetaminophen (PERCOCET) or acetaminophen (TYLENOL)  Follow your Surgeon's instructions regarding Aspirin STOP taking Aspirin, Aspirin Products (Goody Powder, Excedrin Migraine), Ibuprofen (Advil), Naproxen (Aleve), Vitamins and Herbal Products (ie Fish Oil).  Special instructions:             [facility]- Preparing For Surgery  Before surgery, you can play an important role. Because skin is not sterile, your skin needs to be as free of germs as possible. You can reduce the number of germs on your skin by washing with CHG (chlorahexidine gluconate) Soap before surgery.  CHG is an antiseptic cleaner which kills germs and bonds with the skin to continue killing germs even after washing.    Oral Hygiene is also important to reduce your risk of infection.  Remember - BRUSH YOUR TEETH THE MORNING OF SURGERY WITH YOUR REGULAR TOOTHPASTE  Please do not use if you have an allergy to CHG or antibacterial soaps. If your skin becomes reddened/irritated stop using the CHG.  Do not shave (including legs and underarms) for at least 48 hours prior to first CHG shower. It is OK to shave your face.  Please follow these instructions carefully.   1. Shower the NIGHT BEFORE SURGERY and the MORNING OF SURGERY with CHG.   2. If you chose to  wash your hair, wash your hair first as usual with your normal shampoo.  3. After you shampoo,wash your face and private area with the soap you use at home, then rinse your hair and body thoroughly to remove the shampoo and soap.   4. Use CHG as you would any other liquid soap. You can apply CHG directly to the skin and wash gently with a scrungie or a clean washcloth.   5. Apply the CHG Soap to your body ONLY FROM THE NECK DOWN.  Do not use on open wounds or open sores. Avoid contact with your eyes, ears, mouth and genitals (private parts). Wash thoroughly, paying special attention to the area where your surgery will be performed.  6. Thoroughly rinse your body with warm water from the neck down.  7. DO NOT shower/wash with your normal soap after using and rinsing off the CHG Soap.  8. Pat yourself dry with a CLEAN TOWEL.  9. Wear CLEAN PAJAMAS to bed the night before surgery, wear comfortable clothes the morning of surgery  10. Place CLEAN SHEETS on your bed the night of your first shower and DO NOT SLEEP WITH PETS.    Day of Surgery:  Shower as instructed above. Do not wear lotions, powders, or perfumes, or deodorant. Please wear clean clothes to the hospital/surgery center.   Remember to brush your teeth WITH YOUR REGULAR TOOTHPASTE.  Do not wear jewelry, make-up or nail polish.  Do not wear lotions, powders, or perfumes, or deodorant.             Men may shave face and neck.  Do not bring valuables to the hospital.  [facility] is not responsible for any belongings or valuables.  Contacts, dentures or bridgework may not be worn into surgery.  Leave your suitcase in the car.  After surgery it may be brought to your room.  For patients admitted to the hospital, discharge time will be determined by your treatment team.  Patients discharged the day of surgery will not be allowed to drive home.   Please read over the following fact sheets that you were given: Pain Booklet,  Coughing and Deep Breathing, Surgical Site Infections.

## 2019-04-04 NOTE — Progress Notes (Addendum)
PCP -  Dr Osborne O[facility]  Cardiologist - no  Chest x-ray - 04/03/2018  EKG - 11/23/2018  Stress Test - no  ECHO - no  Cardiac Cath - no  Sleep Study - no  CPAP - no  LABS-CBC, BMP  ASA-no  ERAS-no  HA1C- na Fasting Blood Sugar - na Checks Blood Sugar __0___ times a day  Anesthesia-  Pt denies having chest pain, sob, or fever at this time. All instructions explained to the pt, with a verbal understanding of the material. Pt agrees to go over the instructions while at home for a better understanding. Pt also instructed to self quarantine after being tested for COVID-19. The opportunity to ask questions was provided.

## 2019-04-05 ENCOUNTER — Encounter (HOSPITAL_COMMUNITY): Payer: Self-pay

## 2019-04-05 NOTE — Anesthesia Preprocedure Evaluation (Addendum)
Anesthesia Evaluation    Reviewed: Allergy & Precautions, Patient's Chart, lab work & pertinent test results  Airway Mallampati: II  TM Distance: >3 FB Neck ROM: Full    Dental no notable dental hx.    Pulmonary neg pulmonary ROS,    Pulmonary exam normal breath sounds clear to auscultation       Cardiovascular hypertension, Pt. on medications + dysrhythmias Ventricular Tachycardia  Rhythm:Regular Rate:Normal  EKG: 11/23/18: SR. Non-specific ST/T wave abnormality. Baseline wanderer in multiple leads, most apparent in I, II, aVR, V2-3. Borderline prolonged PR 220 ms by my measurement, although appears shorter in aVF but otherwise seems to measure consistently.     Neuro/Psych  Carotid US 02/10/18 ([facility]): IMPRESSION:  Minor carotid atherosclerosis.  No hemodynamically significant ICA stenosis.  Degree of narrowing less than 50% by ultrasound criteria.  Patent antegrade vertebral flow bilaterally.  Neuromuscular disease    GI/Hepatic Neg liver ROS, GERD  Medicated,  Endo/Other  negative endocrine ROS  Renal/GU negative Renal ROS     Musculoskeletal negative musculoskeletal ROS (+)   Abdominal   Peds  Hematology negative hematology ROS (+)   Anesthesia Other Findings Day of surgery medications reviewed with the patient.  Reproductive/Obstetrics                             Anesthesia Physical Anesthesia Plan  ASA: III  Anesthesia Plan: General   Post-op Pain Management:    Induction: Intravenous  PONV Risk Score and Plan: 2 and Ondansetron and Treatment may vary due to age or medical condition  Airway Management Planned: Oral ETT  Additional Equipment:   Intra-op Plan:   Post-operative Plan: Extubation in OR  Informed Consent:   Plan Discussed with: Anesthesiologist  Anesthesia Plan Comments: (PAT note written 04/05/2019 by Shonna Chock, PA-C. )        Anesthesia Quick Evaluation

## 2019-04-05 NOTE — Progress Notes (Signed)
Anesthesia Chart Review:  Case: [id number] Date/Time: 04/06/19 0815   Procedure: Microdiscectomy- extraforaminal  - right - L3-L4 (Right Back)   Anesthesia type: General   Pre-op diagnosis: HNP   [facility] ROOM [facility] / [facility] OR   Surgeons: Tia Alert, MD      DISCUSSION: Patient is an 84 year old male scheduled for the above procedure.  History includes never smoker, spinal stenosis, HTN, GERD, ventricular tachycardia (1985), glaucoma, back surgery (last was L4-5 fusion 08/05/17).  Ventricular tachycardia is listed in his history, so I called patient for more details. He reported that in 1985 his PCP at the time Dr. Clearance Coots saw something on EKG and sent him to [facility] for further evaluation. He wore a Holter monitor and while being monitored apparently had an asymptomatic event "ventricular tachycardia" (denied supraventricular tachycardia) while/after drinking some beers at a [employer]. He believes he spent 10 days in the hospital getting more of a work-up, but did not require intervention other than prescribed a quinine type drug which was discontinued in 1990 by his PCP. He denied diagnosis of CAD or CHF. Other than evaluation in 1985, he has not seen a cardiologist but has had on-going management through primary care. Prior to his reported VT diagnosis he was a runner for 30 years and continued to run for another 25 years following his diagnosis. No syncope, chest pain or SOB. Denied known anesthesia complications. Historically he has been active, but more limited due to back issues for past couple of years--this will be in third back surgery in three years.   04/04/19 presurgical COVID-19 test negative. Anesthesia team to evaluate on the day of surgery.    VS: BP (!) 168/62   Pulse 61   Temp 37.1 C (Oral)   Resp 18   Ht 5\' 7"  (1.702 m)   Wt 73.1 kg   SpO2 97%   BMI 25.25 kg/m    PROVIDERS: , MD is PCP Marguarite Arbour, see [facility] Everywhere)   LABS:  Labs reviewed: Acceptable for surgery. (all labs ordered are listed, but only abnormal results are displayed)  Labs Reviewed  BASIC METABOLIC PANEL - Abnormal; Notable for the following components:      Result Value   CO2 21 (*)    Glucose, Bld 105 (*)    All other components within normal limits  SURGICAL PCR SCREEN  CBC WITH DIFFERENTIAL/PLATELET  PROTIME-INR     IMAGES: CT L-spine 11/23/18: IMPRESSION: - No acute abnormality. - Status post L4-5 discectomy and fusion since the prior MRI. There has been some subsidence of interbody spacer at L4-5 into the endplates and no osseous fusion across the disc interspace or posterior elements is identified. Minimal lucency about the L5 screws may be due to early loosening. - No change in mild central canal narrowing at L2-3. The central canal is otherwise patent. - Atherosclerosis. - Nonobstructing stone upper pole right kidney.   EKG: 11/23/18: SR. Non-specific ST/T wave abnormality. Baseline wanderer in multiple leads, most apparent in I, II, aVR, V2-3. Borderline prolonged PR 220 ms by my measurement, although appears shorter in aVF but otherwise seems to measure consistently.   CV: Carotid 11/25/18 02/10/18 ([facility]): IMPRESSION:       Minor carotid atherosclerosis.  No hemodynamically significant ICA stenosis.  Degree of narrowing less than 50% by ultrasound criteria.  Patent antegrade vertebral flow bilaterally.   Past Medical History:  Diagnosis Date  . Arthritis   .  Back pain   . Bulging lumbar disc   . GERD (gastroesophageal reflux disease)   . Glaucoma   . Humerus fracture   . Hypertension   . Pneumonia    age 23  . Sciatica   . Spinal stenosis   . V tach ([place]) 1985   Had work-up at [facility], treated with medication.     Past Surgical History:  Procedure Laterality Date  . BACK SURGERY  02/13/2016  . BUTTOCK MASS EXCISION    . CATARACT EXTRACTION W/ INTRAOCULAR LENS  IMPLANT, BILATERAL     LASER  PRIOR   . GROIN EXPLORATION     RIGHT  . SKIN CANCERS      18  . TRANSFORAMINAL LUMBAR INTERBODY FUSION (TLIF) WITH PEDICLE SCREW FIXATION 1 LEVEL N/A 08/05/2017   Procedure: Lumbar four-five Transforaminal Lumbar Interbody fusion, Hemifacetectomy Right Lumbar four-five; Pedicle screw fixation Lumbar four-five;  Surgeon: Eustace Moore, MD;  Location: [facility];  Service: Neurosurgery;  Laterality: N/A;    MEDICATIONS: . acetaminophen (TYLENOL) 325 MG tablet  . amLODipine (NORVASC) 5 MG tablet  . Ascorbic Acid (VITAMIN C PO)  . aspirin EC 81 MG tablet  . atorvastatin (LIPITOR) 10 MG tablet  . celecoxib (CELEBREX) 200 MG capsule  . Cholecalciferol (VITAMIN D3 PO)  . enalapril (VASOTEC) 20 MG tablet  . finasteride (PROSCAR) 5 MG tablet  . lidocaine (LIDODERM) 5 %  . Magnesium 250 MG TABS  . Multiple Vitamin (MULTIVITAMIN WITH MINERALS) TABS tablet  . oxyCODONE-acetaminophen (PERCOCET) 5-325 MG tablet  . sildenafil (VIAGRA) 100 MG tablet  . timolol (TIMOPTIC) 0.5 % ophthalmic solution   No current facility-administered medications for this encounter.     Myra Gianotti, PA-C Surgical Short Stay/Anesthesiology [facility] Phone [phone] [facility] Phone [phone] 04/05/2019 4:38 PM

## 2019-04-06 ENCOUNTER — Ambulatory Visit (HOSPITAL_COMMUNITY)
Admission: RE | Admit: 2019-04-06 | Discharge: 2019-04-06 | Disposition: A | Discharge status: Home / Self Care | Payer: Medicare HMO | Attending: Neurological Surgery | Admitting: Neurological Surgery

## 2019-04-06 ENCOUNTER — Encounter (HOSPITAL_COMMUNITY)
Admission: RE | Disposition: A | Discharge status: Home / Self Care | Payer: Self-pay | Source: Home / Self Care | Attending: Neurological Surgery

## 2019-04-06 ENCOUNTER — Ambulatory Visit (HOSPITAL_COMMUNITY): Payer: Medicare HMO

## 2019-04-06 ENCOUNTER — Encounter (HOSPITAL_COMMUNITY): Payer: Self-pay | Admitting: Neurological Surgery

## 2019-04-06 ENCOUNTER — Other Ambulatory Visit: Payer: Self-pay

## 2019-04-06 ENCOUNTER — Ambulatory Visit (HOSPITAL_COMMUNITY): Payer: Medicare HMO | Admitting: Anesthesiology

## 2019-04-06 ENCOUNTER — Ambulatory Visit (HOSPITAL_COMMUNITY): Payer: Medicare HMO | Admitting: Physician Assistant

## 2019-04-06 DIAGNOSIS — M5116 Intervertebral disc disorders with radiculopathy, lumbar region: Secondary | ICD-10-CM | POA: Insufficient documentation

## 2019-04-06 DIAGNOSIS — Z880 Allergy status to penicillin: Secondary | ICD-10-CM | POA: Diagnosis not present

## 2019-04-06 DIAGNOSIS — Z888 Allergy status to other drugs, medicaments and biological substances status: Secondary | ICD-10-CM | POA: Diagnosis not present

## 2019-04-06 DIAGNOSIS — Z7982 Long term (current) use of aspirin: Secondary | ICD-10-CM | POA: Diagnosis not present

## 2019-04-06 DIAGNOSIS — M1990 Unspecified osteoarthritis, unspecified site: Secondary | ICD-10-CM | POA: Insufficient documentation

## 2019-04-06 DIAGNOSIS — Z79899 Other long term (current) drug therapy: Secondary | ICD-10-CM | POA: Insufficient documentation

## 2019-04-06 DIAGNOSIS — K219 Gastro-esophageal reflux disease without esophagitis: Secondary | ICD-10-CM | POA: Diagnosis not present

## 2019-04-06 DIAGNOSIS — H409 Unspecified glaucoma: Secondary | ICD-10-CM | POA: Insufficient documentation

## 2019-04-06 DIAGNOSIS — Z419 Encounter for procedure for purposes other than remedying health state, unspecified: Secondary | ICD-10-CM

## 2019-04-06 DIAGNOSIS — Z9841 Cataract extraction status, right eye: Secondary | ICD-10-CM | POA: Diagnosis not present

## 2019-04-06 DIAGNOSIS — Z791 Long term (current) use of non-steroidal anti-inflammatories (NSAID): Secondary | ICD-10-CM | POA: Diagnosis not present

## 2019-04-06 DIAGNOSIS — Z981 Arthrodesis status: Secondary | ICD-10-CM | POA: Insufficient documentation

## 2019-04-06 DIAGNOSIS — I10 Essential (primary) hypertension: Secondary | ICD-10-CM | POA: Diagnosis not present

## 2019-04-06 DIAGNOSIS — Z9842 Cataract extraction status, left eye: Secondary | ICD-10-CM | POA: Diagnosis not present

## 2019-04-06 DIAGNOSIS — Z961 Presence of intraocular lens: Secondary | ICD-10-CM | POA: Insufficient documentation

## 2019-04-06 DIAGNOSIS — Z885 Allergy status to narcotic agent status: Secondary | ICD-10-CM | POA: Diagnosis not present

## 2019-04-06 DIAGNOSIS — Z85828 Personal history of other malignant neoplasm of skin: Secondary | ICD-10-CM | POA: Diagnosis not present

## 2019-04-06 HISTORY — PX: LUMBAR LAMINECTOMY/DECOMPRESSION MICRODISCECTOMY: SHX5026

## 2019-04-06 SURGERY — LUMBAR LAMINECTOMY/DECOMPRESSION MICRODISCECTOMY 1 LEVEL
Anesthesia: General | Site: Back | Laterality: Right

## 2019-04-06 MED ORDER — ROCURONIUM BROMIDE 10 MG/ML (PF) SYRINGE
PREFILLED_SYRINGE | INTRAVENOUS | Status: DC | PRN
  Administered 2019-04-06: 60 mg via INTRAVENOUS

## 2019-04-06 MED ORDER — MENTHOL 3 MG MT LOZG: 1.0000 | LOZENGE | OROMUCOSAL | Status: DC | PRN

## 2019-04-06 MED ORDER — BUPIVACAINE HCL (PF) 0.25 % IJ SOLN
INTRAMUSCULAR | Status: DC | PRN
  Administered 2019-04-06: 4 mL
  Administered 2019-04-06: 20 mL

## 2019-04-06 MED ORDER — THROMBIN 5000 UNITS EX SOLR: OROMUCOSAL | Status: DC | PRN

## 2019-04-06 MED ORDER — SODIUM CHLORIDE 0.9 % IV SOLN
INTRAVENOUS | Status: DC | PRN
  Administered 2019-04-06: 500 mL

## 2019-04-06 MED ORDER — MEPERIDINE HCL 25 MG/ML IJ SOLN: 6.2500 mg | INTRAMUSCULAR | Status: DC | PRN

## 2019-04-06 MED ORDER — ONDANSETRON HCL 4 MG/2ML IJ SOLN: 4.0000 mg | Freq: Four times a day (QID) | INTRAMUSCULAR | Status: DC | PRN

## 2019-04-06 MED ORDER — OXYCODONE HCL 5 MG PO TABS: 5.0000 mg | ORAL_TABLET | Freq: Once | ORAL | Status: AC | PRN

## 2019-04-06 MED ORDER — SUFENTANIL CITRATE 50 MCG/ML IV SOLN
INTRAVENOUS | Status: AC
  Filled 2019-04-06: qty 1

## 2019-04-06 MED ORDER — ACETAMINOPHEN 325 MG PO TABS: 325.0000 mg | ORAL_TABLET | ORAL | Status: DC | PRN

## 2019-04-06 MED ORDER — PROPOFOL 10 MG/ML IV BOLUS
INTRAVENOUS | Status: AC
  Filled 2019-04-06: qty 20

## 2019-04-06 MED ORDER — MIDAZOLAM HCL 5 MG/5ML IJ SOLN
INTRAMUSCULAR | Status: DC | PRN
  Administered 2019-04-06: 1 mg via INTRAVENOUS

## 2019-04-06 MED ORDER — SODIUM CHLORIDE 0.9 % IV SOLN: 250.0000 mL | INTRAVENOUS | Status: DC

## 2019-04-06 MED ORDER — PROPOFOL 10 MG/ML IV BOLUS
INTRAVENOUS | Status: DC | PRN
  Administered 2019-04-06: 150 mg via INTRAVENOUS

## 2019-04-06 MED ORDER — LIDOCAINE 2% (20 MG/ML) 5 ML SYRINGE
INTRAMUSCULAR | Status: AC
  Filled 2019-04-06: qty 5

## 2019-04-06 MED ORDER — METHOCARBAMOL 1000 MG/10ML IJ SOLN: 500.0000 mg | Freq: Four times a day (QID) | INTRAVENOUS | Status: DC | PRN

## 2019-04-06 MED ORDER — METHOCARBAMOL 500 MG PO TABS: 500.0000 mg | ORAL_TABLET | Freq: Four times a day (QID) | ORAL | Status: DC | PRN

## 2019-04-06 MED ORDER — CHLORHEXIDINE GLUCONATE CLOTH 2 % EX PADS: 6.0000 | MEDICATED_PAD | Freq: Once | CUTANEOUS | Status: DC

## 2019-04-06 MED ORDER — HEMOSTATIC AGENTS (NO CHARGE) OPTIME
TOPICAL | Status: DC | PRN
  Administered 2019-04-06: 1 via TOPICAL

## 2019-04-06 MED ORDER — SUFENTANIL CITRATE 50 MCG/ML IV SOLN
INTRAVENOUS | Status: DC | PRN
  Administered 2019-04-06 (×2): 10 ug via INTRAVENOUS

## 2019-04-06 MED ORDER — OXYCODONE HCL 5 MG PO TABS
ORAL_TABLET | ORAL | Status: AC
  Administered 2019-04-06: 5 mg via ORAL
  Filled 2019-04-06: qty 1

## 2019-04-06 MED ORDER — POTASSIUM CHLORIDE IN NACL 20-0.9 MEQ/L-% IV SOLN: INTRAVENOUS | Status: DC

## 2019-04-06 MED ORDER — ACETAMINOPHEN 10 MG/ML IV SOLN
INTRAVENOUS | Status: DC | PRN
  Administered 2019-04-06: 1000 mg via INTRAVENOUS

## 2019-04-06 MED ORDER — LIDOCAINE 2% (20 MG/ML) 5 ML SYRINGE
INTRAMUSCULAR | Status: DC | PRN
  Administered 2019-04-06: 75 mg via INTRAVENOUS

## 2019-04-06 MED ORDER — SENNA 8.6 MG PO TABS: 1.0000 | ORAL_TABLET | Freq: Two times a day (BID) | ORAL | Status: DC

## 2019-04-06 MED ORDER — ONDANSETRON HCL 4 MG PO TABS: 4.0000 mg | ORAL_TABLET | Freq: Four times a day (QID) | ORAL | Status: DC | PRN

## 2019-04-06 MED ORDER — SODIUM CHLORIDE 0.9% FLUSH: 3.0000 mL | Freq: Two times a day (BID) | INTRAVENOUS | Status: DC

## 2019-04-06 MED ORDER — ACETAMINOPHEN 650 MG RE SUPP: 650.0000 mg | RECTAL | Status: DC | PRN

## 2019-04-06 MED ORDER — THROMBIN 5000 UNITS EX SOLR
CUTANEOUS | Status: DC | PRN
  Administered 2019-04-06 (×2): 5000 [IU] via TOPICAL

## 2019-04-06 MED ORDER — ACETAMINOPHEN 10 MG/ML IV SOLN
INTRAVENOUS | Status: AC
  Filled 2019-04-06: qty 100

## 2019-04-06 MED ORDER — ACETAMINOPHEN 325 MG PO TABS: 650.0000 mg | ORAL_TABLET | ORAL | Status: DC | PRN

## 2019-04-06 MED ORDER — ROCURONIUM BROMIDE 10 MG/ML (PF) SYRINGE
PREFILLED_SYRINGE | INTRAVENOUS | Status: AC
  Filled 2019-04-06: qty 10

## 2019-04-06 MED ORDER — ONDANSETRON HCL 4 MG/2ML IJ SOLN
INTRAMUSCULAR | Status: DC | PRN
  Administered 2019-04-06: 4 mg via INTRAVENOUS

## 2019-04-06 MED ORDER — LACTATED RINGERS IV SOLN: INTRAVENOUS | Status: DC

## 2019-04-06 MED ORDER — SODIUM CHLORIDE (PF) 0.9 % IJ SOLN
INTRAMUSCULAR | Status: AC
  Filled 2019-04-06: qty 10

## 2019-04-06 MED ORDER — FENTANYL CITRATE (PF) 100 MCG/2ML IJ SOLN: 25.0000 ug | INTRAMUSCULAR | Status: DC | PRN

## 2019-04-06 MED ORDER — 0.9 % SODIUM CHLORIDE (POUR BTL) OPTIME
TOPICAL | Status: DC | PRN
  Administered 2019-04-06: 1000 mL

## 2019-04-06 MED ORDER — MORPHINE SULFATE (PF) 2 MG/ML IV SOLN: 2.0000 mg | INTRAVENOUS | Status: DC | PRN

## 2019-04-06 MED ORDER — ACETAMINOPHEN 160 MG/5ML PO SOLN: 325.0000 mg | ORAL | Status: DC | PRN

## 2019-04-06 MED ORDER — THROMBIN 5000 UNITS EX SOLR
CUTANEOUS | Status: AC
  Filled 2019-04-06: qty 15000

## 2019-04-06 MED ORDER — SUGAMMADEX SODIUM 200 MG/2ML IV SOLN
INTRAVENOUS | Status: DC | PRN
  Administered 2019-04-06: 200 mg via INTRAVENOUS

## 2019-04-06 MED ORDER — PHENOL 1.4 % MT LIQD: 1.0000 | OROMUCOSAL | Status: DC | PRN

## 2019-04-06 MED ORDER — ONDANSETRON HCL 4 MG/2ML IJ SOLN
INTRAMUSCULAR | Status: AC
  Filled 2019-04-06: qty 2

## 2019-04-06 MED ORDER — OXYCODONE HCL 5 MG/5ML PO SOLN: 5.0000 mg | Freq: Once | ORAL | Status: AC | PRN

## 2019-04-06 MED ORDER — DEXAMETHASONE SODIUM PHOSPHATE 10 MG/ML IJ SOLN
10.0000 mg | Freq: Once | INTRAMUSCULAR | Status: AC
  Administered 2019-04-06: 10 mg via INTRAVENOUS
  Filled 2019-04-06: qty 1

## 2019-04-06 MED ORDER — ONDANSETRON HCL 4 MG/2ML IJ SOLN: 4.0000 mg | Freq: Once | INTRAMUSCULAR | Status: DC | PRN

## 2019-04-06 MED ORDER — VANCOMYCIN HCL IN DEXTROSE 1-5 GM/200ML-% IV SOLN
1000.0000 mg | INTRAVENOUS | Status: AC
  Administered 2019-04-06: 07:00:00 1000 mg via INTRAVENOUS
  Filled 2019-04-06: qty 200

## 2019-04-06 MED ORDER — SODIUM CHLORIDE 0.9% FLUSH: 3.0000 mL | INTRAVENOUS | Status: DC | PRN

## 2019-04-06 MED ORDER — BUPIVACAINE HCL (PF) 0.25 % IJ SOLN
INTRAMUSCULAR | Status: AC
  Filled 2019-04-06: qty 30

## 2019-04-06 MED ORDER — MIDAZOLAM HCL 2 MG/2ML IJ SOLN
INTRAMUSCULAR | Status: AC
  Filled 2019-04-06: qty 2

## 2019-04-06 SURGICAL SUPPLY — 51 items
BAG DECANTER FOR FLEXI CONT (MISCELLANEOUS) ×3 IMPLANT
BAND RUBBER #18 3X1/16 STRL (MISCELLANEOUS) ×6 IMPLANT
BENZOIN TINCTURE PRP APPL 2/3 (GAUZE/BANDAGES/DRESSINGS) ×3 IMPLANT
BUR CARBIDE MATCH 3.0 (BURR) ×3 IMPLANT
CANISTER SUCT 3000ML PPV (MISCELLANEOUS) ×3 IMPLANT
CARTRIDGE OIL MAESTRO DRILL (MISCELLANEOUS) ×1 IMPLANT
CLOSURE WOUND 1/2 X4 (GAUZE/BANDAGES/DRESSINGS) ×1
COVER WAND RF STERILE (DRAPES) ×3 IMPLANT
DERMABOND ADVANCED (GAUZE/BANDAGES/DRESSINGS) ×2
DERMABOND ADVANCED .7 DNX12 (GAUZE/BANDAGES/DRESSINGS) IMPLANT
DIFFUSER DRILL AIR PNEUMATIC (MISCELLANEOUS) ×3 IMPLANT
DRAPE LAPAROTOMY 100X72X124 (DRAPES) ×3 IMPLANT
DRAPE MICROSCOPE LEICA (MISCELLANEOUS) ×3 IMPLANT
DRAPE SURG 17X23 STRL (DRAPES) ×3 IMPLANT
DRSG OPSITE POSTOP 4X6 (GAUZE/BANDAGES/DRESSINGS) ×2 IMPLANT
DURAPREP 26ML APPLICATOR (WOUND CARE) ×3 IMPLANT
ELECT REM PT RETURN 9FT ADLT (ELECTROSURGICAL) ×3
ELECTRODE REM PT RTRN 9FT ADLT (ELECTROSURGICAL) ×1 IMPLANT
GAUZE 4X4 16PLY RFD (DISPOSABLE) IMPLANT
GLOVE BIO SURGEON STRL SZ7 (GLOVE) ×2 IMPLANT
GLOVE BIO SURGEON STRL SZ8 (GLOVE) ×3 IMPLANT
GLOVE BIOGEL PI IND STRL 7.0 (GLOVE) IMPLANT
GLOVE BIOGEL PI IND STRL 7.5 (GLOVE) IMPLANT
GLOVE BIOGEL PI INDICATOR 7.0 (GLOVE) ×2
GLOVE BIOGEL PI INDICATOR 7.5 (GLOVE) ×6
GLOVE SURG SS PI 7.5 STRL IVOR (GLOVE) ×6 IMPLANT
GOWN STRL REUS W/ TWL LRG LVL3 (GOWN DISPOSABLE) IMPLANT
GOWN STRL REUS W/ TWL XL LVL3 (GOWN DISPOSABLE) ×1 IMPLANT
GOWN STRL REUS W/TWL 2XL LVL3 (GOWN DISPOSABLE) IMPLANT
GOWN STRL REUS W/TWL LRG LVL3 (GOWN DISPOSABLE) ×4
GOWN STRL REUS W/TWL XL LVL3 (GOWN DISPOSABLE) ×2
HEMOSTAT POWDER SURGIFOAM 1G (HEMOSTASIS) ×2 IMPLANT
KIT BASIN OR (CUSTOM PROCEDURE TRAY) ×3 IMPLANT
KIT TURNOVER KIT B (KITS) ×3 IMPLANT
NDL HYPO 25X1 1.5 SAFETY (NEEDLE) ×1 IMPLANT
NDL SPNL 20GX3.5 QUINCKE YW (NEEDLE) IMPLANT
NEEDLE HYPO 25X1 1.5 SAFETY (NEEDLE) ×3 IMPLANT
NEEDLE SPNL 20GX3.5 QUINCKE YW (NEEDLE) ×3 IMPLANT
NS IRRIG 1000ML POUR BTL (IV SOLUTION) ×3 IMPLANT
OIL CARTRIDGE MAESTRO DRILL (MISCELLANEOUS) ×3
PACK LAMINECTOMY NEURO (CUSTOM PROCEDURE TRAY) ×3 IMPLANT
PAD ARMBOARD 7.5X6 YLW CONV (MISCELLANEOUS) ×3 IMPLANT
SPONGE SURGIFOAM ABS GEL SZ50 (HEMOSTASIS) ×2 IMPLANT
STRIP CLOSURE SKIN 1/2X4 (GAUZE/BANDAGES/DRESSINGS) ×2 IMPLANT
SUT VIC AB 0 CT1 18XCR BRD8 (SUTURE) ×1 IMPLANT
SUT VIC AB 0 CT1 8-18 (SUTURE) ×2
SUT VIC AB 2-0 CP2 18 (SUTURE) ×3 IMPLANT
SUT VIC AB 3-0 SH 8-18 (SUTURE) ×3 IMPLANT
TOWEL GREEN STERILE (TOWEL DISPOSABLE) ×3 IMPLANT
TOWEL GREEN STERILE FF (TOWEL DISPOSABLE) ×3 IMPLANT
WATER STERILE IRR 1000ML POUR (IV SOLUTION) ×3 IMPLANT

## 2019-04-06 NOTE — Op Note (Signed)
04/06/2019  10:19 AM  PATIENT:  Paul Hobbs  84 y.o. 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 Imaging showed large extraforaminal disc herniation L3-4 on the right. The patient tried conservative measures without relief. Pain was debilitating. Recommended right L3-4 extraforaminal microdiscectomy. Patient understood the risks, benefits, and alternatives and potential outcomes and wished to proceed.  PROCEDURE DETAILS: The patient was taken to the operating room and after induction of adequate generalized endotracheal anesthesia, the patient was rolled into the prone position on the Wilson frame and all pressure points were padded. The lumbar region was cleaned and then prepped with DuraPrep and draped in the usual sterile fashion. 5 cc of local anesthesia was injected and then a dorsal midline incision was made and carried down to the lumbo sacral fascia. The fascia was opened and the paraspinous musculature was taken down in a subperiosteal fashion to expose L3-4 on the right. Intraoperative x-ray confirmed my level, and then I dissected out over the pars and superior facet of L3-4 on the right.  I used a combination of the high-speed drill and the Kerrison punches to perform a extraforaminal foraminotomy at L3-4 on the right.  I drilled the lateral part of the pars and the superior part of the L3-4 facet to expose the underlying ligament.  The underlying yellow ligament was opened and removed in a  piecemeal fashion to expose the underlying  exiting nerve root. I undercut the until I was  distal to the pedicle. The nerve root was well decompressed.  Is an obvious free fragment underneath the distal part of the L3 nerve root.  We carefully dissected between the nerve root and the disc and removed a fairly significant amount of extraforaminal disc herniation.  . I then palpated with a coronary dilator along the nerve root and into the foramen to assure adequate decompression. I felt no more compression of the nerve root. I irrigated with saline solution containing bacitracin. Achieved hemostasis with bipolar cautery,  and then closed the fascia with 0 Vicryl. I closed the subcutaneous tissues with 2-0 Vicryl and the subcuticular tissues with 3-0 Vicryl. The skin was then closed with benzoin and Steri-Strips. The drapes were removed, a sterile dressing was applied.  My nurse practitioner was involved in the exposure, safe retraction of the neural elements, the disc work and the closure. the patient was awakened from general anesthesia and transferred to the recovery room in stable condition. At the end of the procedure all sponge, needle and instrument counts were correct.    PLAN OF CARE: Discharge to home after PACU  PATIENT DISPOSITION:  PACU - hemodynamically stable.   Delay start of Pharmacological VTE agent (>24hrs) due to surgical blood loss or risk of bleeding:  yes

## 2019-04-06 NOTE — Anesthesia Postprocedure Evaluation (Signed)
Anesthesia Post Note  Patient: Paul Hobbs  Procedure(s) Performed: Microdiscectomy- extraforaminal  - right - Lumbar three-Lumbar four (Right Back)     Patient location during evaluation: PACU Anesthesia Type: General Level of consciousness: awake and alert Pain management: pain level controlled Vital Signs Assessment: post-procedure vital signs reviewed and stable Respiratory status: spontaneous breathing, nonlabored ventilation, respiratory function stable and patient connected to nasal cannula oxygen Cardiovascular status: blood pressure returned to baseline and stable Postop Assessment: no apparent nausea or vomiting Anesthetic complications: no    Last Vitals:  Vitals:   04/06/19 1055 04/06/19 1100  BP:  (!) 172/68  Pulse: 66 76  Resp:    Temp: 36.6 C   SpO2: 100% 100%    Last Pain:  Vitals:   04/06/19 1100  PainSc: 2                  Lorrin Nawrot

## 2019-04-06 NOTE — Anesthesia Procedure Notes (Signed)
Procedure Name: Intubation Date/Time: 04/06/2019 8:29 AM Performed by: Moshe Salisbury, CRNA Pre-anesthesia Checklist: Patient identified, Emergency Drugs available, Suction available and Patient being monitored Patient Re-evaluated:Patient Re-evaluated prior to induction Oxygen Delivery Method: Circle System Utilized Preoxygenation: Pre-oxygenation with 100% oxygen Induction Type: IV induction Ventilation: Mask ventilation without difficulty Laryngoscope Size: Mac and 4 Grade View: Grade II Tube type: Oral Tube size: 8.0 mm Number of attempts: 1 Airway Equipment and Method: Stylet Placement Confirmation: ETT inserted through vocal cords under direct vision,  positive ETCO2 and breath sounds checked- equal and bilateral Secured at: 21 cm Tube secured with: Tape Dental Injury: Teeth and Oropharynx as per pre-operative assessment

## 2019-04-06 NOTE — Discharge Instructions (Addendum)

## 2019-04-06 NOTE — H&P (Signed)
Subjective: Patient is a 84 y.o. male admitted for R leg pain with numbness and difficulty waking. Onset of symptoms was several weeks ago, gradually improving since that time.  The pain is rated moderate, and is located at the across the lower back and radiates to RLE. The pain is described as aching and occurs all day. The symptoms have been progressive. Symptoms are exacerbated by exercise. MRI or CT showed large HNP L3-4 R   Past Medical History:  Diagnosis Date  . Arthritis   . Back pain   . Bulging lumbar disc   . GERD (gastroesophageal reflux disease)   . Glaucoma   . Humerus fracture   . Hypertension   . Pneumonia    age 41  . Sciatica   . Spinal stenosis   . V tach ([place]) 1985   Had work-up at [facility], treated with medication.     Past Surgical History:  Procedure Laterality Date  . BACK SURGERY  02/13/2016  . BUTTOCK MASS EXCISION    . CATARACT EXTRACTION W/ INTRAOCULAR LENS  IMPLANT, BILATERAL     LASER PRIOR   . GROIN EXPLORATION     RIGHT  . SKIN CANCERS      20  . TRANSFORAMINAL LUMBAR INTERBODY FUSION (TLIF) WITH PEDICLE SCREW FIXATION 1 LEVEL N/A 08/05/2017   Procedure: Lumbar four-five Transforaminal Lumbar Interbody fusion, Hemifacetectomy Right Lumbar four-five; Pedicle screw fixation Lumbar four-five;  Surgeon: Eustace Moore, MD;  Location: [facility];  Service: Neurosurgery;  Laterality: N/A;    Prior to Admission medications   Medication Sig Start Date End Date Taking? Authorizing Provider  acetaminophen (TYLENOL) 325 MG tablet Take 325-650 mg by mouth every 6 (six) hours as needed (for pain.).    Yes [provider]  amLODipine (NORVASC) 5 MG tablet Take 5 mg by mouth 2 (two) times daily.    Yes [provider]  Ascorbic Acid (VITAMIN C PO) Take 1 tablet by mouth daily.   Yes [provider]  aspirin EC 81 MG tablet Take 81 mg by mouth daily.   Yes [provider]  atorvastatin (LIPITOR) 10 MG tablet Take 10 mg by mouth  every evening.  07/13/15  Yes [provider]  celecoxib (CELEBREX) 200 MG capsule Take 1 capsule (200 mg total) by mouth every 12 (twelve) hours. 08/07/17  Yes Meyran, Ocie Cornfield, NP  Cholecalciferol (VITAMIN D3 PO) Take 1 tablet by mouth daily.   Yes [provider]  enalapril (VASOTEC) 20 MG tablet Take 20 mg by mouth 2 (two) times daily.   Yes [provider]  finasteride (PROSCAR) 5 MG tablet Take 5 mg by mouth every evening.    Yes [provider]  Magnesium 250 MG TABS Take 250 mg by mouth daily.   Yes [provider]  Multiple Vitamin (MULTIVITAMIN WITH MINERALS) TABS tablet Take 1 tablet by mouth daily.   Yes [provider]  sildenafil (VIAGRA) 100 MG tablet Take 100 mg by mouth daily as needed for erectile dysfunction.   Yes [provider]  timolol (TIMOPTIC) 0.5 % ophthalmic solution Place 1 drop into both eyes 2 (two) times daily.   Yes [provider]  lidocaine (LIDODERM) 5 % Place 1 patch onto the skin every 12 (twelve) hours. Remove & Discard patch within 12 hours or as directed by MD Patient not taking: Reported on 03/28/2019 11/23/18 11/23/19  Blake Divine, MD  oxyCODONE-acetaminophen (PERCOCET) 5-325 MG tablet Take 1 tablet by mouth  every 6 (six) hours as needed for severe pain. Patient not taking: Reported on 03/28/2019 11/23/18 11/23/19  Chesley Noon, MD   Allergies  Allergen Reactions  . Ezetimibe Other (See Comments)    Myalgias  . Gemfibrozil Other (See Comments)    Myalgias  . Lovastatin Other (See Comments)    Myalgias  . Penicillins Swelling and Other (See Comments)    PATIENT HAS HAD A PCN REACTION WITH IMMEDIATE RASH, FACIAL/TONGUE/THROAT SWELLING, SOB, OR LIGHTHEADEDNESS WITH HYPOTENSION:  #  #  YES  #  #  Has patient had a PCN reaction causing severe rash involving mucus membranes or skin necrosis: No Has patient had a PCN reaction that required hospitalization No Has patient had a  PCN reaction occurring within the last 10 years: No If all of the above answers are "NO", then may proceed with Cephalosporin use.   Marland Kitchen Hydrocodone Other (See Comments)    HALLUCINATIONS  . Morphine And Related Other (See Comments)    ALL OPIOIDS Pt reports Halluncinations    Social History   Tobacco Use  . Smoking status: Never Smoker  . Smokeless tobacco: Never Used  Substance Use Topics  . Alcohol use: Yes    Alcohol/week: 2.0 standard drinks    Types: 2 Shots of liquor per week    History reviewed. No pertinent family history.   Review of Systems  Positive ROS: neg  All other systems have been reviewed and were otherwise negative with the exception of those mentioned in the HPI and as above.  Objective: Vital signs in last 24 hours: Temp:  [98.1 F (36.7 C)] 98.1 F (36.7 C) (01/06 0627) Pulse Rate:  [62] 62 (01/06 0627) Resp:  [18] 18 (01/06 0627) BP: (168)/(59) 168/59 (01/06 0627) SpO2:  [98 %] 98 % (01/06 0627) Weight:  [70.3 kg] 70.3 kg (01/06 0627)  General Appearance: Alert, cooperative, no distress, appears stated age Head: Normocephalic, without obvious abnormality, atraumatic Eyes: PERRL, conjunctiva/corneas clear, EOM's intact    Neck: Supple, symmetrical, trachea midline Back: Symmetric, no curvature, ROM normal, no CVA tenderness Lungs:  respirations unlabored Heart: Regular rate and rhythm Abdomen: Soft, non-tender Extremities: Extremities normal, atraumatic, no cyanosis or edema Pulses: 2+ and symmetric all extremities Skin: Skin color, texture, turgor normal, no rashes or lesions  NEUROLOGIC:   Mental status: Alert and oriented x4,  no aphasia, good attention span, fund of knowledge, and memory Motor Exam - grossly normal Sensory Exam - grossly normal Reflexes: trace Coordination - grossly normal Gait - limps R Balance - grossly normal Cranial Nerves: I: smell Not tested  II: visual acuity  OS: nl    OD: nl  II: visual fields Full to  confrontation  II: pupils Equal, round, reactive to light  III,VII: ptosis None  III,IV,VI: extraocular muscles  Full ROM  V: mastication Normal  V: facial light touch sensation  Normal  V,VII: corneal reflex  Present  VII: facial muscle function - upper  Normal  VII: facial muscle function - lower Normal  VIII: hearing Not tested  IX: soft palate elevation  Normal  IX,X: gag reflex Present  XI: trapezius strength  5/5  XI: sternocleidomastoid strength 5/5  XI: neck flexion strength  5/5  XII: tongue strength  Normal    Data Review Lab Results  Component Value Date   WBC 8.9 04/04/2019   HGB 13.4 04/04/2019   HCT 40.0 04/04/2019   MCV 89.3 04/04/2019   PLT 266 04/04/2019   Lab Results  Component Value Date   NA 137 04/04/2019   K 3.8 04/04/2019   CL 105 04/04/2019   CO2 21 (L) 04/04/2019   BUN 8 04/04/2019   CREATININE 0.70 04/04/2019   GLUCOSE 105 (H) 04/04/2019   Lab Results  Component Value Date   INR 1.0 04/04/2019    Assessment/Plan:  Estimated body mass index is 23.57 kg/m as calculated from the following:   Height as of this encounter: 5\' 8"  (1.727 m).   Weight as of this encounter: 70.3 kg. Patient admitted for R L3-4 extraforaminal microdiskectomy. Patient has failed a reasonable attempt at conservative therapy.  I explained the condition and procedure to the patient and answered any questions.  Patient wishes to proceed with procedure as planned. Understands risks/ benefits and typical outcomes of procedure.   DETRICK DANI 04/06/2019 8:09 AM

## 2019-04-06 NOTE — Transfer of Care (Signed)
Immediate Anesthesia Transfer of Care Note  Patient: Paul Hobbs  Procedure(s) Performed: Microdiscectomy- extraforaminal  - right - Lumbar three-Lumbar four (Right Back)  Patient Location: PACU  Anesthesia Type:General  Level of Consciousness: awake and patient cooperative  Airway & Oxygen Therapy: Patient Spontanous Breathing and Patient connected to nasal cannula oxygen  Post-op Assessment: Report given to RN, Post -op Vital signs reviewed and stable and Patient moving all extremities  Post vital signs: Reviewed and stable  Last Vitals:  Vitals Value Taken Time  BP    Temp    Pulse 65 04/06/19 1003  Resp 15 04/06/19 1003  SpO2 100 % 04/06/19 1003  Vitals shown include unvalidated device data.  Last Pain:  Vitals:   04/06/19 0659  PainSc: 0-No pain      Patients Stated Pain Goal: 3 (04/06/19 0659)  Complications: No apparent anesthesia complications

## 2019-07-06 ENCOUNTER — Ambulatory Visit: Payer: Medicare HMO | Admitting: Urology

## 2019-07-06 ENCOUNTER — Other Ambulatory Visit: Payer: Self-pay

## 2019-07-06 ENCOUNTER — Encounter: Payer: Self-pay | Admitting: Urology

## 2019-07-06 VITALS — BP 168/74 | HR 56 | Ht 68.0 in | Wt 150.0 lb

## 2019-07-06 DIAGNOSIS — R972 Elevated prostate specific antigen [PSA]: Secondary | ICD-10-CM | POA: Insufficient documentation

## 2019-07-06 NOTE — Progress Notes (Signed)
07/06/2019 12:50 PM   Paul Hobbs [birth year] [id number]  Referring provider: Marguarite Arbour, MD [address] [facility] [place],  [place] [address]  Chief Complaint  Patient presents with  . Elevated PSA    HPI: Paul Hobbs is an 84 y.o. male seen at request of Dr. Judithann Sheen for evaluation of an elevated PSA.  -Prostate biopsy performed at [facility] March 2001 for a PSA in the 5 range with benign pathology -States his PSA was not checked for a few years after that but then not checked until 2018 by Dr. Judithann Sheen and was elevated at 14.48 -Was started on finasteride at that time and PSA was stable in the 6 range -Was 7.20 February 2019 and 8.26 May 2019 -No bothersome lower urinary tract symptoms -Denies dysuria, gross hematuria -Denies flank, abdominal or pelvic pain     PMH: Past Medical History:  Diagnosis Date  . Arthritis   . Back pain   . Bulging lumbar disc   . GERD (gastroesophageal reflux disease)   . Glaucoma   . Humerus fracture   . Hypertension   . Pneumonia    age 45  . Sciatica   . Spinal stenosis   . V tach ([facility]) 1985   Had work-up at [facility], treated with medication.     Surgical History: Past Surgical History:  Procedure Laterality Date  . BACK SURGERY  02/13/2016  . BUTTOCK MASS EXCISION    . CATARACT EXTRACTION W/ INTRAOCULAR LENS  IMPLANT, BILATERAL     LASER PRIOR   . GROIN EXPLORATION     RIGHT  . LUMBAR LAMINECTOMY/DECOMPRESSION MICRODISCECTOMY Right 04/06/2019   Procedure: Microdiscectomy- extraforaminal  - right - Lumbar three-Lumbar four;  Surgeon: Tia Alert, MD;  Location: [facility] OR;  Service: Neurosurgery;  Laterality: Right;  . SKIN CANCERS      20  . TRANSFORAMINAL LUMBAR INTERBODY FUSION (TLIF) WITH PEDICLE SCREW FIXATION 1 LEVEL N/A 08/05/2017   Procedure: Lumbar four-five Transforaminal Lumbar Interbody fusion, Hemifacetectomy Right Lumbar four-five; Pedicle screw fixation Lumbar four-five;  Surgeon: Tia Alert, MD;  Location: [facility] OR;  Service: Neurosurgery;  Laterality: N/A;    Home Medications:  Allergies as of 07/06/2019      Reactions   Ezetimibe Other (See Comments)   Myalgias   Gemfibrozil Other (See Comments)   Myalgias   Lovastatin Other (See Comments)   Myalgias   Penicillins Swelling, Other (See Comments)   PATIENT HAS HAD A PCN REACTION WITH IMMEDIATE RASH, FACIAL/TONGUE/THROAT SWELLING, SOB, OR LIGHTHEADEDNESS WITH HYPOTENSION:  #  #  YES  #  #  Has patient had a PCN reaction causing severe rash involving mucus membranes or skin necrosis: No Has patient had a PCN reaction that required hospitalization No Has patient had a PCN reaction occurring within the last 10 years: No If all of the above answers are "NO", then may proceed with Cephalosporin use.   Hydrocodone Other (See Comments)   HALLUCINATIONS   Morphine And Related Other (See Comments)   ALL OPIOIDS Pt reports Halluncinations      Medication List       Accurate as of July 06, 2019 12:50 PM. If you have any questions, ask your nurse or doctor.        acetaminophen 325 MG tablet Commonly known as: TYLENOL Take 325-650 mg by mouth every 6 (six) hours as needed (for pain.).   amLODipine 5 MG tablet Commonly known as: NORVASC Take 5 mg  by mouth 2 (two) times daily.   aspirin EC 81 MG tablet Take 81 mg by mouth daily.   atorvastatin 10 MG tablet Commonly known as: LIPITOR Take 10 mg by mouth every evening.   celecoxib 200 MG capsule Commonly known as: CELEBREX Take 1 capsule (200 mg total) by mouth every 12 (twelve) hours.   enalapril 20 MG tablet Commonly known as: VASOTEC Take 20 mg by mouth 2 (two) times daily.   finasteride 5 MG tablet Commonly known as: PROSCAR Take 5 mg by mouth every evening.   Fish Oil 1000 MG Caps Take by mouth.   lidocaine 5 % Commonly known as: Lidoderm Place 1 patch onto the skin every 12 (twelve) hours. Remove & Discard patch within 12 hours or as directed by  MD   Magnesium 250 MG Tabs Take 250 mg by mouth daily.   multivitamin with minerals Tabs tablet Take 1 tablet by mouth daily.   oxyCODONE-acetaminophen 5-325 MG tablet Commonly known as: Percocet Take 1 tablet by mouth every 6 (six) hours as needed for severe pain.   sildenafil 100 MG tablet Commonly known as: VIAGRA Take 100 mg by mouth daily as needed for erectile dysfunction.   timolol 0.5 % ophthalmic solution Commonly known as: TIMOPTIC Place 1 drop into both eyes 2 (two) times daily.   VITAMIN C PO Take 1 tablet by mouth daily.   VITAMIN D3 PO Take 1 tablet by mouth daily.       Allergies:  Allergies  Allergen Reactions  . Ezetimibe Other (See Comments)    Myalgias  . Gemfibrozil Other (See Comments)    Myalgias  . Lovastatin Other (See Comments)    Myalgias  . Penicillins Swelling and Other (See Comments)    PATIENT HAS HAD A PCN REACTION WITH IMMEDIATE RASH, FACIAL/TONGUE/THROAT SWELLING, SOB, OR LIGHTHEADEDNESS WITH HYPOTENSION:  #  #  YES  #  #  Has patient had a PCN reaction causing severe rash involving mucus membranes or skin necrosis: No Has patient had a PCN reaction that required hospitalization No Has patient had a PCN reaction occurring within the last 10 years: No If all of the above answers are "NO", then may proceed with Cephalosporin use.   Marland Kitchen Hydrocodone Other (See Comments)    HALLUCINATIONS  . Morphine And Related Other (See Comments)    ALL OPIOIDS Pt reports Halluncinations    Family History: No family history on file.  Social History:  reports that he has never smoked. He has never used smokeless tobacco. He reports current alcohol use of about 2.0 standard drinks of alcohol per week. He reports that he does not use drugs.   Physical Exam: BP (!) 168/74   Pulse (!) 56   Ht 5\' 8"  (1.727 m)   Wt 150 lb (68 kg)   BMI 22.81 kg/m   Constitutional:  Alert and oriented, No acute distress. HEENT: [place] AT, moist mucus membranes.   Trachea midline, no masses. Cardiovascular: No clubbing, cyanosis, or edema. Respiratory: Normal respiratory effort, no increased work of breathing. GI: Abdomen is soft, nontender, nondistended, no abdominal masses GU: Prostate 50 g, smooth without nodules   Assessment & Plan:    - Elevated PSA Most recent PSA uncorrected for finasteride is 16.5.  DRE benign.  He has previously refused repeat prostate biopsy.  I recommended scheduling a prostate MRI.  He was in agreement and will be notified with results and further recommendations.   Abbie Sons, MD  [facility] [address], [place], [place] [address] [phone]

## 2019-07-18 ENCOUNTER — Other Ambulatory Visit: Payer: Self-pay

## 2019-07-18 ENCOUNTER — Ambulatory Visit
Admission: RE | Admit: 2019-07-18 | Discharge: 2019-07-18 | Disposition: A | Discharge status: Home / Self Care | Payer: Medicare HMO | Source: Ambulatory Visit | Attending: Urology | Admitting: Urology

## 2019-07-18 DIAGNOSIS — R972 Elevated prostate specific antigen [PSA]: Secondary | ICD-10-CM

## 2019-07-18 MED ORDER — GADOBUTROL 1 MMOL/ML IV SOLN
6.0000 mL | Freq: Once | INTRAVENOUS | Status: AC | PRN
  Administered 2019-07-18: 6 mL via INTRAVENOUS

## 2019-07-26 ENCOUNTER — Telehealth: Payer: Self-pay | Admitting: Urology

## 2019-07-26 NOTE — Telephone Encounter (Signed)
I contacted Mr. Paul Hobbs regarding his prostate MRI results.  The prostate volume was 47 cc.  There was transition zone enlargement with multiple enlarged BPH nodules.  There were no lesions suspicious for Gleason 7 or greater adenocarcinoma.  No pelvic adenopathy or bony abnormalities.  The findings were discussed in detail with Mr. Paul Hobbs.  We discussed the false-negative rate of prostate MRI be approximately 15-20% and that a negative MRI does not rule out the possibility of prostate cancer although the majority of cancers will be low-grade.  Management options were discussed of prostate biopsy versus continued surveillance.  He has elected surveillance and will see him back in August 2021 with a PSA prior.

## 2019-07-27 NOTE — Telephone Encounter (Signed)
App made and mailed ° °[name] ° °

## 2019-11-21 ENCOUNTER — Other Ambulatory Visit: Payer: Self-pay

## 2019-11-21 DIAGNOSIS — R972 Elevated prostate specific antigen [PSA]: Secondary | ICD-10-CM

## 2019-11-22 ENCOUNTER — Other Ambulatory Visit: Payer: Self-pay

## 2019-11-22 ENCOUNTER — Other Ambulatory Visit: Payer: Medicare HMO

## 2019-11-22 DIAGNOSIS — R972 Elevated prostate specific antigen [PSA]: Secondary | ICD-10-CM

## 2019-11-23 LAB — PSA: Prostate Specific Ag, Serum: 7.7 ng/mL — ABNORMAL HIGH (ref 0.0–4.0)

## 2019-11-30 ENCOUNTER — Other Ambulatory Visit: Payer: Self-pay

## 2019-11-30 ENCOUNTER — Ambulatory Visit: Payer: Medicare HMO | Admitting: Urology

## 2019-11-30 ENCOUNTER — Encounter: Payer: Self-pay | Admitting: Urology

## 2019-11-30 VITALS — BP 158/75 | HR 64 | Ht 68.0 in | Wt 155.0 lb

## 2019-11-30 DIAGNOSIS — R972 Elevated prostate specific antigen [PSA]: Secondary | ICD-10-CM | POA: Diagnosis not present

## 2019-11-30 DIAGNOSIS — N401 Enlarged prostate with lower urinary tract symptoms: Secondary | ICD-10-CM

## 2019-11-30 NOTE — Progress Notes (Signed)
11/30/2019 10:09 PM   Paul Hobbs 1935/12/18 [id number]  Referring provider: Marguarite Arbour, MD [address] [facility] [place],  [place] [address]  Chief Complaint  Patient presents with   Elevated PSA    HPI: 84 y.o. male presents for follow-up of an elevated PSA.  -Prostate biopsy performed at [facility] March 2001 for a PSA in the 5 range with benign pathology -States his PSA was not checked for a few years after that but then not checked until 2018 by Dr. Judithann Sheen and was elevated at 14.48 -Was started on finasteride at that time and PSA was stable in the 6 range -PSA trend as below -Prostate MRI 07/18/2019 with 47 g gland; PI-RADS 1 without suspicious lesions -Elected surveillance -Most recent PSA 11/22/2019 7.7 (uncorrected) -Denies dysuria, gross hematuria -Denies flank, abdominal or pelvic pain    PMH: Past Medical History:  Diagnosis Date   Arthritis    Back pain    Bulging lumbar disc    GERD (gastroesophageal reflux disease)    Glaucoma    Humerus fracture    Hypertension    Pneumonia    age 16   Sciatica    Spinal stenosis    V tach (HCC) 1985   Had work-up at [facility], treated with medication.     Surgical History: Past Surgical History:  Procedure Laterality Date   BACK SURGERY  02/13/2016   BUTTOCK MASS EXCISION     CATARACT EXTRACTION W/ INTRAOCULAR LENS  IMPLANT, BILATERAL     LASER PRIOR    GROIN EXPLORATION     RIGHT   LUMBAR LAMINECTOMY/DECOMPRESSION MICRODISCECTOMY Right 04/06/2019   Procedure: Microdiscectomy- extraforaminal  - right - Lumbar three-Lumbar four;  Surgeon: Tia Alert, MD;  Location: [facility] OR;  Service: Neurosurgery;  Laterality: Right;   SKIN CANCERS      18   TRANSFORAMINAL LUMBAR INTERBODY FUSION (TLIF) WITH PEDICLE SCREW FIXATION 1 LEVEL N/A 08/05/2017   Procedure: Lumbar four-five Transforaminal Lumbar Interbody fusion, Hemifacetectomy Right Lumbar four-five; Pedicle screw fixation  Lumbar four-five;  Surgeon: Tia Alert, MD;  Location: [facility] OR;  Service: Neurosurgery;  Laterality: N/A;    Home Medications:  Allergies as of 11/30/2019      Reactions   Ezetimibe Other (See Comments)   Myalgias   Gemfibrozil Other (See Comments)   Myalgias   Lovastatin Other (See Comments)   Myalgias   Penicillins Swelling, Other (See Comments)   PATIENT HAS HAD A PCN REACTION WITH IMMEDIATE RASH, FACIAL/TONGUE/THROAT SWELLING, SOB, OR LIGHTHEADEDNESS WITH HYPOTENSION:  #  #  YES  #  #  Has patient had a PCN reaction causing severe rash involving mucus membranes or skin necrosis: No Has patient had a PCN reaction that required hospitalization No Has patient had a PCN reaction occurring within the last 10 years: No If all of the above answers are "NO", then may proceed with Cephalosporin use.   Hydrocodone Other (See Comments)   HALLUCINATIONS   Morphine And Related Other (See Comments)   ALL OPIOIDS Pt reports Halluncinations      Medication List       Accurate as of November 30, 2019 10:09 PM. If you have any questions, ask your nurse or doctor.        acetaminophen 325 MG tablet Commonly known as: TYLENOL Take 325-650 mg by mouth every 6 (six) hours as needed (for pain.).   amLODipine 5 MG tablet Commonly known as: NORVASC Take 5 mg  by mouth 2 (two) times daily.   aspirin EC 81 MG tablet Take 81 mg by mouth daily.   atorvastatin 10 MG tablet Commonly known as: LIPITOR Take 10 mg by mouth every evening.   celecoxib 200 MG capsule Commonly known as: CELEBREX Take 1 capsule (200 mg total) by mouth every 12 (twelve) hours.   enalapril 20 MG tablet Commonly known as: VASOTEC Take 20 mg by mouth 2 (two) times daily.   finasteride 5 MG tablet Commonly known as: PROSCAR Take 5 mg by mouth every evening.   Fish Oil 1000 MG Caps Take by mouth.   Magnesium 250 MG Tabs Take 250 mg by mouth daily.   multivitamin with minerals Tabs tablet Take 1 tablet by  mouth daily.   sildenafil 100 MG tablet Commonly known as: VIAGRA Take 100 mg by mouth daily as needed for erectile dysfunction.   timolol 0.5 % ophthalmic solution Commonly known as: TIMOPTIC Place 1 drop into both eyes 2 (two) times daily.   VITAMIN C PO Take 1 tablet by mouth daily.   VITAMIN D3 PO Take 1 tablet by mouth daily.       Allergies:  Allergies  Allergen Reactions   Ezetimibe Other (See Comments)    Myalgias   Gemfibrozil Other (See Comments)    Myalgias   Lovastatin Other (See Comments)    Myalgias   Penicillins Swelling and Other (See Comments)    PATIENT HAS HAD A PCN REACTION WITH IMMEDIATE RASH, FACIAL/TONGUE/THROAT SWELLING, SOB, OR LIGHTHEADEDNESS WITH HYPOTENSION:  #  #  YES  #  #  Has patient had a PCN reaction causing severe rash involving mucus membranes or skin necrosis: No Has patient had a PCN reaction that required hospitalization No Has patient had a PCN reaction occurring within the last 10 years: No If all of the above answers are "NO", then may proceed with Cephalosporin use.    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 No pertinent family history.  Social History:  reports that he has never smoked. He has never used smokeless tobacco. He reports current alcohol use of about 2.0 standard drinks of alcohol per week. He reports that he does not use drugs.   Physical Exam: BP (!) 158/75    Pulse 64    Ht 5\' 8"  (1.727 m)    Wt 155 lb (70.3 kg)    BMI 23.57 kg/m   Constitutional:  Alert and oriented, No acute distress. HEENT: [place] AT, moist mucus membranes.  Trachea midline, no masses. Cardiovascular: No clubbing, cyanosis, or edema. Respiratory: Normal respiratory effort, no increased work of breathing.   Assessment & Plan:    1.  Elevated PSA  Recent prostate MRI showed no suspicious  lesions  We had discussed previously the false-negative rate or MRI however he declines further evaluation/biopsy  Since he has refused additional biopsies would recommend his PSA only be checked annually and would recommend they be performed in our office   , MD  [facility] [address], [address] [place], [place] [phone]

## 2020-01-09 ENCOUNTER — Ambulatory Visit: Payer: Medicare HMO | Attending: Internal Medicine

## 2020-01-09 DIAGNOSIS — Z23 Encounter for immunization: Secondary | ICD-10-CM

## 2020-01-09 NOTE — Progress Notes (Signed)
   Covid-19 Vaccination Clinic  Name:  Paul Hobbs    MRN: [id number] DOB: 11-29-1935  01/09/2020  [name] was observed post Covid-19 immunization for 15 minutes without incident. He was provided with Vaccine Information Sheet and instruction to access the V-Safe system.   [name] was instructed to call 911 with any severe reactions post vaccine: Marland Kitchen Difficulty breathing  . Swelling of face and throat  . A fast heartbeat  . A bad rash all over body  . Dizziness and weakness

## 2020-09-03 ENCOUNTER — Other Ambulatory Visit: Payer: Self-pay | Admitting: *Deleted

## 2020-09-03 DIAGNOSIS — R972 Elevated prostate specific antigen [PSA]: Secondary | ICD-10-CM

## 2020-11-30 ENCOUNTER — Other Ambulatory Visit: Payer: Medicare HMO

## 2020-11-30 ENCOUNTER — Other Ambulatory Visit: Payer: Self-pay

## 2020-11-30 DIAGNOSIS — R972 Elevated prostate specific antigen [PSA]: Secondary | ICD-10-CM

## 2020-12-01 LAB — PSA: Prostate Specific Ag, Serum: 7.9 ng/mL — ABNORMAL HIGH (ref 0.0–4.0)

## 2020-12-05 ENCOUNTER — Ambulatory Visit: Payer: Medicare HMO | Admitting: Urology

## 2020-12-05 ENCOUNTER — Other Ambulatory Visit: Payer: Self-pay

## 2020-12-05 ENCOUNTER — Encounter: Payer: Self-pay | Admitting: Urology

## 2020-12-05 VITALS — BP 176/64 | HR 68 | Ht 68.0 in | Wt 150.0 lb

## 2020-12-05 DIAGNOSIS — N401 Enlarged prostate with lower urinary tract symptoms: Secondary | ICD-10-CM | POA: Diagnosis not present

## 2020-12-05 DIAGNOSIS — R972 Elevated prostate specific antigen [PSA]: Secondary | ICD-10-CM

## 2020-12-05 NOTE — Progress Notes (Signed)
12/05/2020 2:23 PM   Paul Hobbs [birth year] [id number]  Referring provider: Marguarite Arbour, MD [address] [facility] [place],  [place] [address]  Chief Complaint  Patient presents with   Elevated PSA    Urologic history: 1.  Elevated PSA Prostate biopsy 05/1999 at [facility]; PSA 5 range with benign pathology PSA in 2018 was 14.48 Was started on finasteride by PCP and stable PSA in 6 range Prostate MRI 06/2019 with 47 cc gland and no suspicious lesions He elected surveillance   HPI: 85 y.o. male presents for annual follow-up.  Doing well since last visit No bothersome LUTS Denies dysuria, gross hematuria Denies flank, abdominal or pelvic pain Remains on finasteride PSA 11/30/2020 stable 7.9 (uncorrected)  PMH: Past Medical History:  Diagnosis Date   Arthritis    Back pain    Bulging lumbar disc    GERD (gastroesophageal reflux disease)    Glaucoma    Humerus fracture    Hypertension    Pneumonia    age 84   Sciatica    Spinal stenosis    V tach (HCC) 1985   Had work-up at [facility], treated with medication.     Surgical History: Past Surgical History:  Procedure Laterality Date   BACK SURGERY  02/13/2016   BUTTOCK MASS EXCISION     CATARACT EXTRACTION W/ INTRAOCULAR LENS  IMPLANT, BILATERAL     LASER PRIOR    GROIN EXPLORATION     RIGHT   LUMBAR LAMINECTOMY/DECOMPRESSION MICRODISCECTOMY Right 04/06/2019   Procedure: Microdiscectomy- extraforaminal  - right - Lumbar three-Lumbar four;  Surgeon: Tia Alert, MD;  Location: [facility] OR;  Service: Neurosurgery;  Laterality: Right;   SKIN CANCERS      18   TRANSFORAMINAL LUMBAR INTERBODY FUSION (TLIF) WITH PEDICLE SCREW FIXATION 1 LEVEL N/A 08/05/2017   Procedure: Lumbar four-five Transforaminal Lumbar Interbody fusion, Hemifacetectomy Right Lumbar four-five; Pedicle screw fixation Lumbar four-five;  Surgeon: Tia Alert, MD;  Location: [facility] OR;  Service: Neurosurgery;  Laterality: N/A;     Home Medications:  Allergies as of 12/05/2020       Reactions   Ezetimibe Other (See Comments)   Myalgias   Gemfibrozil Other (See Comments)   Myalgias   Lovastatin Other (See Comments)   Myalgias   Penicillins Swelling, Other (See Comments)   PATIENT HAS HAD A PCN REACTION WITH IMMEDIATE RASH, FACIAL/TONGUE/THROAT SWELLING, SOB, OR LIGHTHEADEDNESS WITH HYPOTENSION:  #  #  YES  #  #  Has patient had a PCN reaction causing severe rash involving mucus membranes or skin necrosis: No Has patient had a PCN reaction that required hospitalization No Has patient had a PCN reaction occurring within the last 10 years: No If all of the above answers are "NO", then may proceed with Cephalosporin use.   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 If you have any questions, ask your nurse or doctor.          acetaminophen 325 MG tablet Commonly known as: TYLENOL Take 325-650 mg by mouth every 6 (six) hours as needed (for pain.).   amLODipine 5 MG tablet Commonly known as: NORVASC Take 5 mg by mouth 2 (two) times daily.   aspirin EC 81 MG tablet Take  81 mg by mouth daily.   atorvastatin 10 MG tablet Commonly known as: LIPITOR Take 10 mg by mouth every evening.   celecoxib 200 MG capsule Commonly known as: CELEBREX Take 1 capsule (200 mg total) by mouth every 12 (twelve) hours.   enalapril 20 MG tablet Commonly known as: VASOTEC Take 20 mg by mouth 2 (two) times daily.   finasteride 5 MG tablet Commonly known as: PROSCAR Take 5 mg by mouth every evening.   Fish Oil 1000 MG Caps Take by mouth.   Magnesium 250 MG Tabs Take 250 mg by mouth daily.   multivitamin with minerals Tabs tablet Take 1 tablet by mouth daily.   sildenafil 100 MG tablet Commonly known as: VIAGRA Take 100 mg by mouth daily as  needed for erectile dysfunction.   timolol 0.5 % ophthalmic solution Commonly known as: TIMOPTIC Place 1 drop into both eyes 2 (two) times daily.   VITAMIN C PO Take 1 tablet by mouth daily.   VITAMIN D3 PO Take 1 tablet by mouth daily.        Allergies:  Allergies  Allergen Reactions   Ezetimibe Other (See Comments)    Myalgias   Gemfibrozil Other (See Comments)    Myalgias   Lovastatin Other (See Comments)    Myalgias   Penicillins Swelling and Other (See Comments)    PATIENT HAS HAD A PCN REACTION WITH IMMEDIATE RASH, FACIAL/TONGUE/THROAT SWELLING, SOB, OR LIGHTHEADEDNESS WITH HYPOTENSION:  #  #  YES  #  #  Has patient had a PCN reaction causing severe rash involving mucus membranes or skin necrosis: No Has patient had a PCN reaction that required hospitalization No Has patient had a PCN reaction occurring within the last 10 years: No If all of the above answers are "NO", then may proceed with Cephalosporin use.    Hydrocodone Other (See Comments)    HALLUCINATIONS   Morphine And Related Other (See Comments)    ALL OPIOIDS Pt reports Halluncinations    Family History: No family history on file.  Social History:  reports that he has never smoked. He has never used smokeless tobacco. He reports current alcohol use of about 2.0 standard drinks per week. He reports that he does not use drugs.   Physical Exam: BP (!) 176/64   Pulse 68   Ht 5\' 8"  (1.727 m)   Wt 150 lb (68 kg)   BMI 22.81 kg/m   Constitutional:  Alert and oriented, No acute distress. HEENT: [place] AT, moist mucus membranes.  Trachea midline, no masses. Cardiovascular: No clubbing, cyanosis, or edema. Respiratory: Normal respiratory effort, no increased work of breathing. GU: Declined DRE today Lymph: No cervical or inguinal lymphadenopathy. Psychiatric: Normal mood and affect.   Assessment & Plan:    1.  Elevated PSA Stable Desires to continue active surveillance 1 year follow-up with PSA  prior  2.  BPH with LUTS Mild LUTS Continue finasteride   , MD  [facility] [address], [address] [place], [place] [phone]

## 2021-08-29 ENCOUNTER — Other Ambulatory Visit: Payer: Self-pay | Admitting: Internal Medicine

## 2021-08-29 DIAGNOSIS — R634 Abnormal weight loss: Secondary | ICD-10-CM

## 2021-09-10 ENCOUNTER — Ambulatory Visit
Admission: RE | Admit: 2021-09-10 | Discharge: 2021-09-10 | Disposition: A | Discharge status: Home / Self Care | Payer: Medicare HMO | Source: Ambulatory Visit | Attending: Internal Medicine | Admitting: Internal Medicine

## 2021-09-10 DIAGNOSIS — R634 Abnormal weight loss: Secondary | ICD-10-CM

## 2021-09-10 MED ORDER — IOPAMIDOL (ISOVUE-300) INJECTION 61%
100.0000 mL | Freq: Once | INTRAVENOUS | Status: AC | PRN
  Administered 2021-09-10: 100 mL via INTRAVENOUS

## 2021-12-05 ENCOUNTER — Other Ambulatory Visit: Payer: Medicare HMO

## 2021-12-05 DIAGNOSIS — R972 Elevated prostate specific antigen [PSA]: Secondary | ICD-10-CM

## 2021-12-06 ENCOUNTER — Ambulatory Visit: Payer: Medicare HMO | Admitting: Urology

## 2021-12-06 ENCOUNTER — Encounter: Payer: Self-pay | Admitting: Urology

## 2021-12-06 VITALS — BP 155/70 | HR 61 | Ht 69.0 in | Wt 154.0 lb

## 2021-12-06 DIAGNOSIS — R972 Elevated prostate specific antigen [PSA]: Secondary | ICD-10-CM

## 2021-12-06 DIAGNOSIS — N401 Enlarged prostate with lower urinary tract symptoms: Secondary | ICD-10-CM

## 2021-12-06 LAB — PSA: Prostate Specific Ag, Serum: 8.3 ng/mL — ABNORMAL HIGH (ref 0.0–4.0)

## 2021-12-06 NOTE — Progress Notes (Signed)
12/06/2021 12:04 PM   Paul Hobbs 01-16-1936 [id number]  Referring provider: Marguarite Arbour, MD [address] [facility] [place],  [place] [address]  Chief Complaint  Patient presents with   Elevated PSA    Urologic history: 1.  Elevated PSA Prostate biopsy 05/1999 at [facility]; PSA 5 range with benign pathology PSA in 2018 was 14.48 Was started on finasteride by PCP and stable PSA in 6 range Prostate MRI 06/2019 with 47 cc gland and no suspicious lesions He elected surveillance   HPI: 86 y.o. male presents for annual follow-up.  Doing well since last visit No bothersome LUTS Denies dysuria, gross hematuria Denies flank, abdominal or pelvic pain Remains on finasteride PSA 11/30/2020 stable 7.9 (uncorrected)  PMH: Past Medical History:  Diagnosis Date   Arthritis    Back pain    Bulging lumbar disc    GERD (gastroesophageal reflux disease)    Glaucoma    Humerus fracture    Hypertension    Pneumonia    age 19   Sciatica    Spinal stenosis    V tach (HCC) 1985   Had work-up at [facility], treated with medication.     Surgical History: Past Surgical History:  Procedure Laterality Date   BACK SURGERY  02/13/2016   BUTTOCK MASS EXCISION     CATARACT EXTRACTION W/ INTRAOCULAR LENS  IMPLANT, BILATERAL     LASER PRIOR    GROIN EXPLORATION     RIGHT   LUMBAR LAMINECTOMY/DECOMPRESSION MICRODISCECTOMY Right 04/06/2019   Procedure: Microdiscectomy- extraforaminal  - right - Lumbar three-Lumbar four;  Surgeon: Tia Alert, MD;  Location: [facility] OR;  Service: Neurosurgery;  Laterality: Right;   SKIN CANCERS      18   TRANSFORAMINAL LUMBAR INTERBODY FUSION (TLIF) WITH PEDICLE SCREW FIXATION 1 LEVEL N/A 08/05/2017   Procedure: Lumbar four-five Transforaminal Lumbar Interbody fusion, Hemifacetectomy Right Lumbar four-five; Pedicle screw fixation Lumbar four-five;  Surgeon: Tia Alert, MD;  Location: [facility] OR;  Service: Neurosurgery;  Laterality: N/A;     Home Medications:  Allergies as of 12/06/2021       Reactions   Ezetimibe Other (See Comments)   Myalgias   Gemfibrozil Other (See Comments)   Myalgias   Lovastatin Other (See Comments)   Myalgias   Penicillins Swelling, Other (See Comments)   PATIENT HAS HAD A PCN REACTION WITH IMMEDIATE RASH, FACIAL/TONGUE/THROAT SWELLING, SOB, OR LIGHTHEADEDNESS WITH HYPOTENSION:  #  #  YES  #  #  Has patient had a PCN reaction causing severe rash involving mucus membranes or skin necrosis: No Has patient had a PCN reaction that required hospitalization No Has patient had a PCN reaction occurring within the last 10 years: No If all of the above answers are "NO", then may proceed with Cephalosporin use.   Hydrocodone Other (See Comments)   HALLUCINATIONS   Morphine And Related Other (See Comments)   ALL OPIOIDS Pt reports Halluncinations        Medication List        Accurate as of December 06, 2021 12:04 PM. If you have any questions, ask your nurse or doctor.          acetaminophen 325 MG tablet Commonly known as: TYLENOL Take 325-650 mg by mouth every 6 (six) hours as needed (for pain.).   amLODipine 5 MG tablet Commonly known as: NORVASC Take 5 mg by mouth 2 (two) times daily.   aspirin EC 81 MG tablet Take 81  mg by mouth daily.   atorvastatin 10 MG tablet Commonly known as: LIPITOR Take 10 mg by mouth every evening.   celecoxib 200 MG capsule Commonly known as: CELEBREX Take 1 capsule (200 mg total) by mouth every 12 (twelve) hours.   enalapril 20 MG tablet Commonly known as: VASOTEC Take 20 mg by mouth 2 (two) times daily.   finasteride 5 MG tablet Commonly known as: PROSCAR Take 5 mg by mouth every evening.   Fish Oil 1000 MG Caps Take by mouth.   Magnesium 250 MG Tabs Take 250 mg by mouth daily.   multivitamin with minerals Tabs tablet Take 1 tablet by mouth daily.   sildenafil 100 MG tablet Commonly known as: VIAGRA Take 100 mg by mouth daily as  needed for erectile dysfunction.   timolol 0.5 % ophthalmic solution Commonly known as: TIMOPTIC Place 1 drop into both eyes 2 (two) times daily.   VITAMIN C PO Take 1 tablet by mouth daily.   VITAMIN D3 PO Take 1 tablet by mouth daily.        Allergies:  Allergies  Allergen Reactions   Ezetimibe Other (See Comments)    Myalgias   Gemfibrozil Other (See Comments)    Myalgias   Lovastatin Other (See Comments)    Myalgias   Penicillins Swelling and Other (See Comments)    PATIENT HAS HAD A PCN REACTION WITH IMMEDIATE RASH, FACIAL/TONGUE/THROAT SWELLING, SOB, OR LIGHTHEADEDNESS WITH HYPOTENSION:  #  #  YES  #  #  Has patient had a PCN reaction causing severe rash involving mucus membranes or skin necrosis: No Has patient had a PCN reaction that required hospitalization No Has patient had a PCN reaction occurring within the last 10 years: No If all of the above answers are "NO", then may proceed with Cephalosporin use.    Hydrocodone Other (See Comments)    HALLUCINATIONS   Morphine And Related Other (See Comments)    ALL OPIOIDS Pt reports Halluncinations    Family History: No family history on file.  Social History:  reports that he has never smoked. He has never used smokeless tobacco. He reports current alcohol use of about 2.0 standard drinks of alcohol per week. He reports that he does not use drugs.   Physical Exam: BP (!) 155/70   Pulse 61   Ht 5\' 9"  (1.753 m)   Wt 154 lb (69.9 kg)   BMI 22.74 kg/m   Constitutional:  Alert and oriented, No acute distress. HEENT: [place] AT, moist mucus membranes.  Trachea midline, no masses. Cardiovascular: No clubbing, cyanosis, or edema. Respiratory: Normal respiratory effort, no increased work of breathing. GU: Declined DRE today Lymph: No cervical or inguinal lymphadenopathy. Psychiatric: Normal mood and affect.   Assessment & Plan:    1.  Elevated PSA Slight increase; he states Dr. will repeat in  November Desires to continue active surveillance 1 year follow-up with PSA prior  2.  BPH with LUTS Mild LUTS Continue finasteride   December, MD  [facility] [address] [place], [place] [phone]

## 2021-12-10 ENCOUNTER — Other Ambulatory Visit: Payer: Self-pay | Admitting: Neurological Surgery

## 2021-12-10 DIAGNOSIS — M5416 Radiculopathy, lumbar region: Secondary | ICD-10-CM

## 2021-12-13 ENCOUNTER — Ambulatory Visit
Admission: RE | Admit: 2021-12-13 | Discharge: 2021-12-13 | Disposition: A | Discharge status: Home / Self Care | Payer: Medicare HMO | Source: Ambulatory Visit | Attending: Neurological Surgery | Admitting: Neurological Surgery

## 2021-12-13 DIAGNOSIS — M5416 Radiculopathy, lumbar region: Secondary | ICD-10-CM

## 2022-12-02 ENCOUNTER — Other Ambulatory Visit: Payer: Self-pay | Admitting: *Deleted

## 2022-12-02 DIAGNOSIS — R972 Elevated prostate specific antigen [PSA]: Secondary | ICD-10-CM

## 2022-12-08 ENCOUNTER — Other Ambulatory Visit: Payer: Medicare HMO

## 2022-12-08 DIAGNOSIS — R972 Elevated prostate specific antigen [PSA]: Secondary | ICD-10-CM

## 2022-12-09 LAB — PSA: Prostate Specific Ag, Serum: 11.4 ng/mL — ABNORMAL HIGH (ref 0.0–4.0)

## 2022-12-12 ENCOUNTER — Ambulatory Visit: Payer: Medicare HMO | Admitting: Urology

## 2022-12-29 ENCOUNTER — Ambulatory Visit: Payer: Medicare HMO | Admitting: Urology

## 2022-12-29 DIAGNOSIS — R972 Elevated prostate specific antigen [PSA]: Secondary | ICD-10-CM | POA: Diagnosis not present

## 2022-12-29 NOTE — Progress Notes (Signed)
I, [name], acting as a scribe for Riki Altes, MD., have documented all relevant documentation on the behalf of Riki Altes, MD, as directed by Riki Altes, MD while in the presence of Riki Altes, MD.  12/29/2022 3:36 PM   [name] Nov 09, 1935 [id number]  Referring provider: Marguarite Arbour, MD [address] [facility] [place],  [place] [address]  Chief Complaint  Patient presents with   Elevated PSA   Urologic history: 1.  Elevated PSA Prostate biopsy 05/1999 at [facility]; PSA 5 range with benign pathology PSA in 2018 was 14.48 Was started on finasteride by PCP and stable PSA in 6 range Prostate MRI 06/2019 with 47 cc gland and no suspicious lesions He elected surveillance  HPI: Paul Hobbs is a 87 y.o. male presents for annual follow-up.  Doing well since last visit No bothersome LUTS Denies dysuria, gross hematuria Denies flank, abdominal or pelvic pain Remains on finasteride PSA 12/08/22 11.4 (uncorrected).  PSA trend   Prostate Specific Ag, Serum  Latest Ref Rng 0.0 - 4.0 ng/mL  11/22/2019 7.7 (H)   11/30/2020 7.9 (H)   12/05/2021 8.3 (H)   12/08/2022 11.4 (H)       PMH: Past Medical History:  Diagnosis Date   Arthritis    Back pain    Bulging lumbar disc    GERD (gastroesophageal reflux disease)    Glaucoma    Humerus fracture    Hypertension    Pneumonia    age 69   Sciatica    Spinal stenosis    V tach (HCC) 1985   Had work-up at [facility], treated with medication.     Surgical History: Past Surgical History:  Procedure Laterality Date   BACK SURGERY  02/13/2016   BUTTOCK MASS EXCISION     CATARACT EXTRACTION W/ INTRAOCULAR LENS  IMPLANT, BILATERAL     LASER PRIOR    GROIN EXPLORATION     RIGHT   LUMBAR LAMINECTOMY/DECOMPRESSION MICRODISCECTOMY Right 04/06/2019   Procedure: Microdiscectomy- extraforaminal  - right - Lumbar three-Lumbar four;  Surgeon: Tia Alert, MD;  Location: [facility] OR;  Service:  Neurosurgery;  Laterality: Right;   SKIN CANCERS      18   TRANSFORAMINAL LUMBAR INTERBODY FUSION (TLIF) WITH PEDICLE SCREW FIXATION 1 LEVEL N/A 08/05/2017   Procedure: Lumbar four-five Transforaminal Lumbar Interbody fusion, Hemifacetectomy Right Lumbar four-five; Pedicle screw fixation Lumbar four-five;  Surgeon: Tia Alert, MD;  Location: [facility] OR;  Service: Neurosurgery;  Laterality: N/A;    Home Medications:  Allergies as of 12/29/2022       Reactions   Ezetimibe Other (See Comments)   Myalgias   Gemfibrozil Other (See Comments)   Myalgias   Lovastatin Other (See Comments)   Myalgias   Penicillins Swelling, Other (See Comments)   PATIENT HAS HAD A PCN REACTION WITH IMMEDIATE RASH, FACIAL/TONGUE/THROAT SWELLING, SOB, OR LIGHTHEADEDNESS WITH HYPOTENSION:  #  #  YES  #  #  Has patient had a PCN reaction causing severe rash involving mucus membranes or skin necrosis: No Has patient had a PCN reaction that required hospitalization No Has patient had a PCN reaction occurring within the last 10 years: No If all of the above answers are "NO", then may proceed with Cephalosporin use.   Hydrocodone Other (See Comments)   HALLUCINATIONS   Morphine And Codeine Other (See Comments)   ALL OPIOIDS Pt reports Halluncinations  Medication List        Accurate as of December 29, 2022 11:59 PM. If you have any questions, ask your nurse or doctor.          acetaminophen 325 MG tablet Commonly known as: TYLENOL Take 325-650 mg by mouth every 6 (six) hours as needed (for pain.).   amLODipine 5 MG tablet Commonly known as: NORVASC Take 5 mg by mouth 2 (two) times daily.   aspirin EC 81 MG tablet Take 81 mg by mouth daily.   atorvastatin 10 MG tablet Commonly known as: LIPITOR Take 10 mg by mouth every evening.   celecoxib 200 MG capsule Commonly known as: CELEBREX Take 1 capsule (200 mg total) by mouth every 12 (twelve) hours.   enalapril 20 MG tablet Commonly known  as: VASOTEC Take 20 mg by mouth 2 (two) times daily.   finasteride 5 MG tablet Commonly known as: PROSCAR Take 5 mg by mouth every evening.   Fish Oil 1000 MG Caps Take by mouth.   Magnesium 250 MG Tabs Take 250 mg by mouth daily.   multivitamin with minerals Tabs tablet Take 1 tablet by mouth daily.   sildenafil 100 MG tablet Commonly known as: VIAGRA Take 100 mg by mouth daily as needed for erectile dysfunction.   timolol 0.5 % ophthalmic solution Commonly known as: TIMOPTIC Place 1 drop into both eyes 2 (two) times daily.   VITAMIN C PO Take 1 tablet by mouth daily.   VITAMIN D3 PO Take 1 tablet by mouth daily.        Allergies:  Allergies  Allergen Reactions   Ezetimibe Other (See Comments)    Myalgias   Gemfibrozil Other (See Comments)    Myalgias   Lovastatin Other (See Comments)    Myalgias   Penicillins Swelling and Other (See Comments)    PATIENT HAS HAD A PCN REACTION WITH IMMEDIATE RASH, FACIAL/TONGUE/THROAT SWELLING, SOB, OR LIGHTHEADEDNESS WITH HYPOTENSION:  #  #  YES  #  #  Has patient had a PCN reaction causing severe rash involving mucus membranes or skin necrosis: No Has patient had a PCN reaction that required hospitalization No Has patient had a PCN reaction occurring within the last 10 years: No If all of the above answers are "NO", then may proceed with Cephalosporin use.    Hydrocodone Other (See Comments)    HALLUCINATIONS   Morphine And Codeine Other (See Comments)    ALL OPIOIDS Pt reports Halluncinations    Social History:  reports that he has never smoked. He has never used smokeless tobacco. He reports current alcohol use of about 2.0 standard drinks of alcohol per week. He reports that he does not use drugs.   Physical Exam: Constitutional:  Alert and oriented, No acute distress. HEENT: [place] AT, moist mucus membranes.  Trachea midline, no masses. Cardiovascular: No clubbing, cyanosis, or edema. Respiratory: Normal respiratory  effort, no increased work of breathing. GI: Abdomen is soft, nontender, nondistended, no abdominal masses GU: Decline DRE Skin: No rashes, bruises or suspicious lesions. Neurologic: Grossly intact, no focal deficits, moving all 4 extremities. Psychiatric: Normal mood and affect.   Assessment & Plan:    1. Elevated PSA Rising PSA though patient is asymptomatic.  We again discuss the possibility of high-grade prostate cancer and options discussed including repeating his MRI, prostate and prostate biopsy.  He inquired about continued observation. We discussed his life expectancy based on Social Security tables is 5.65 years if he was diagnosed with  high-grade prostate cancer, one of his options would be observation versus ADT or radiation therapy. He has elected at this point to continue observation and we'll see him back in 6 months with a repeat PSA.   I have reviewed the above documentation for accuracy and completeness, and I agree with the above.   Riki Altes, MD  [facility] [address] [place], [place] [address] [phone]

## 2022-12-30 ENCOUNTER — Encounter: Payer: Self-pay | Admitting: Urology

## 2023-06-24 ENCOUNTER — Other Ambulatory Visit: Payer: Self-pay

## 2023-06-25 ENCOUNTER — Other Ambulatory Visit

## 2023-06-25 DIAGNOSIS — R972 Elevated prostate specific antigen [PSA]: Secondary | ICD-10-CM

## 2023-06-26 LAB — PSA: Prostate Specific Ag, Serum: 10.2 ng/mL — ABNORMAL HIGH (ref 0.0–4.0)

## 2023-07-01 ENCOUNTER — Ambulatory Visit: Payer: Self-pay | Admitting: Urology

## 2023-07-01 VITALS — BP 195/68 | HR 65 | Ht 67.0 in | Wt 150.0 lb

## 2023-07-01 DIAGNOSIS — R972 Elevated prostate specific antigen [PSA]: Secondary | ICD-10-CM

## 2023-07-01 NOTE — Progress Notes (Signed)
 I, [name], acting as a scribe for Riki Altes, MD., have documented all relevant documentation on the behalf of Riki Altes, MD, as directed by Riki Altes, MD while in the presence of Riki Altes, MD.  07/01/2023 7:20 PM   Paul Hobbs February 26, 1936 [id number]  Referring provider: Marguarite Arbour, MD [address] [facility] [place],  [place] [address]  Chief Complaint  Patient presents with   Elevated PSA   Urologic history: 1.  Elevated PSA Prostate biopsy 05/1999 at [facility]; PSA 5 range with benign pathology PSA in 2018 was 14.48 Was started on finasteride by PCP and stable PSA in 6 range Prostate MRI 06/2019 with 47 cc gland and no suspicious lesions He elected surveillance  HPI: Paul Hobbs is a 88 y.o. male presents for annual follow-up.  Doing well since last visit No bothersome LUTS Denies dysuria, gross hematuria Denies flank, abdominal or pelvic pain Remains on finasteride PSA 06/25/23 10.2 (uncorrected)   PMH: Past Medical History:  Diagnosis Date   Arthritis    Back pain    Bulging lumbar disc    GERD (gastroesophageal reflux disease)    Glaucoma    Humerus fracture    Hypertension    Pneumonia    age 55   Sciatica    Spinal stenosis    V tach (HCC) 1985   Had work-up at [facility], treated with medication.     Surgical History: Past Surgical History:  Procedure Laterality Date   BACK SURGERY  02/13/2016   BUTTOCK MASS EXCISION     CATARACT EXTRACTION W/ INTRAOCULAR LENS  IMPLANT, BILATERAL     LASER PRIOR    GROIN EXPLORATION     RIGHT   LUMBAR LAMINECTOMY/DECOMPRESSION MICRODISCECTOMY Right 04/06/2019   Procedure: Microdiscectomy- extraforaminal  - right - Lumbar three-Lumbar four;  Surgeon: Tia Alert, MD;  Location: [facility] OR;  Service: Neurosurgery;  Laterality: Right;   SKIN CANCERS      18   TRANSFORAMINAL LUMBAR INTERBODY FUSION (TLIF) WITH PEDICLE SCREW FIXATION 1 LEVEL N/A 08/05/2017    Procedure: Lumbar four-five Transforaminal Lumbar Interbody fusion, Hemifacetectomy Right Lumbar four-five; Pedicle screw fixation Lumbar four-five;  Surgeon: Tia Alert, MD;  Location: [facility] OR;  Service: Neurosurgery;  Laterality: N/A;    Home Medications:  Allergies as of 07/01/2023       Reactions   Ezetimibe Other (See Comments)   Myalgias   Gemfibrozil Other (See Comments)   Myalgias   Lovastatin Other (See Comments)   Myalgias   Penicillins Swelling, Other (See Comments)   PATIENT HAS HAD A PCN REACTION WITH IMMEDIATE RASH, FACIAL/TONGUE/THROAT SWELLING, SOB, OR LIGHTHEADEDNESS WITH HYPOTENSION:  #  #  YES  #  #  Has patient had a PCN reaction causing severe rash involving mucus membranes or skin necrosis: No Has patient had a PCN reaction that required hospitalization No Has patient had a PCN reaction occurring within the last 10 years: No If all of the above answers are "NO", then may proceed with Cephalosporin use.   Hydrocodone Other (See Comments)   HALLUCINATIONS   Morphine And Codeine Other (See Comments)   ALL OPIOIDS Pt reports Halluncinations        Medication List        Accurate as of July 01, 2023  7:20 PM. If you have any questions, ask your nurse or doctor.          acetaminophen  325 MG tablet Commonly known as: TYLENOL Take 325-650 mg by mouth every 6 (six) hours as needed (for pain.).   amLODipine 5 MG tablet Commonly known as: NORVASC Take 5 mg by mouth 2 (two) times daily.   aspirin EC 81 MG tablet Take 81 mg by mouth daily.   atorvastatin 10 MG tablet Commonly known as: LIPITOR Take 10 mg by mouth every evening.   celecoxib 200 MG capsule Commonly known as: CELEBREX Take 1 capsule (200 mg total) by mouth every 12 (twelve) hours.   enalapril 20 MG tablet Commonly known as: VASOTEC Take 20 mg by mouth 2 (two) times daily.   finasteride 5 MG tablet Commonly known as: PROSCAR Take 5 mg by mouth every evening.   Fish Oil 1000 MG  Caps Take by mouth.   Magnesium 250 MG Tabs Take 250 mg by mouth daily.   multivitamin with minerals Tabs tablet Take 1 tablet by mouth daily.   sildenafil 100 MG tablet Commonly known as: VIAGRA Take 100 mg by mouth daily as needed for erectile dysfunction.   timolol 0.5 % ophthalmic solution Commonly known as: TIMOPTIC Place 1 drop into both eyes 2 (two) times daily.   VITAMIN C PO Take 1 tablet by mouth daily.   VITAMIN D3 PO Take 1 tablet by mouth daily.        Allergies:  Allergies  Allergen Reactions   Ezetimibe Other (See Comments)    Myalgias   Gemfibrozil Other (See Comments)    Myalgias   Lovastatin Other (See Comments)    Myalgias   Penicillins Swelling and Other (See Comments)    PATIENT HAS HAD A PCN REACTION WITH IMMEDIATE RASH, FACIAL/TONGUE/THROAT SWELLING, SOB, OR LIGHTHEADEDNESS WITH HYPOTENSION:  #  #  YES  #  #  Has patient had a PCN reaction causing severe rash involving mucus membranes or skin necrosis: No Has patient had a PCN reaction that required hospitalization No Has patient had a PCN reaction occurring within the last 10 years: No If all of the above answers are "NO", then may proceed with Cephalosporin use.    Hydrocodone Other (See Comments)    HALLUCINATIONS   Morphine And Codeine Other (See Comments)    ALL OPIOIDS Pt reports Halluncinations    Social History:  reports that he has never smoked. He has never used smokeless tobacco. He reports current alcohol use of about 2.0 standard drinks of alcohol per week. He reports that he does not use drugs.   Physical Exam: BP (!) 195/68   Pulse 65   Ht 5\' 7"  (1.702 m)   Wt 150 lb (68 kg)   BMI 23.49 kg/m   Constitutional:  Alert and oriented, No acute distress. HEENT: [place] AT, moist mucus membranes.  Trachea midline, no masses. Cardiovascular: No clubbing, cyanosis, or edema. Respiratory: Normal respiratory effort, no increased work of breathing. GI: Abdomen is soft, nontender,  nondistended, no abdominal masses GU: Decline DRE. Skin: No rashes, bruises or suspicious lesions. Neurologic: Grossly intact, no focal deficits, moving all 4 extremities. Psychiatric: Normal mood and affect.   Assessment & Plan:    1. Elevated PSA We discussed his PSA is approximately 20.4 when corrected for finasteride effect. He declined DRE. He declined repeating a prostate MRI. He desires to continue observation as we discussed at our last visit Lab visits scheduled for a repeat PSA 6 months and office visit in 1 year.  He is presently asymptomatic and if he develops voiding symptoms, gross  hematuria, or bony pain, he was instructed to call earlier.  [facility] [address], [address] [place], [place] [address] [phone]

## 2023-07-02 ENCOUNTER — Encounter: Payer: Self-pay | Admitting: Urology

## 2023-08-13 ENCOUNTER — Other Ambulatory Visit: Payer: Self-pay | Admitting: Internal Medicine

## 2023-08-13 DIAGNOSIS — R270 Ataxia, unspecified: Secondary | ICD-10-CM

## 2023-08-19 ENCOUNTER — Ambulatory Visit
Admission: RE | Admit: 2023-08-19 | Discharge: 2023-08-19 | Disposition: A | Discharge status: Home / Self Care | Source: Ambulatory Visit | Attending: Internal Medicine | Admitting: Internal Medicine

## 2023-08-19 DIAGNOSIS — R270 Ataxia, unspecified: Secondary | ICD-10-CM

## 2023-09-01 ENCOUNTER — Other Ambulatory Visit: Payer: Self-pay | Admitting: Physical Medicine & Rehabilitation

## 2023-09-01 DIAGNOSIS — R260 Ataxic gait: Secondary | ICD-10-CM

## 2023-09-02 ENCOUNTER — Encounter: Payer: Self-pay | Admitting: Physical Medicine & Rehabilitation

## 2023-09-07 ENCOUNTER — Ambulatory Visit
Admission: RE | Admit: 2023-09-07 | Discharge: 2023-09-07 | Disposition: A | Discharge status: Home / Self Care | Source: Ambulatory Visit | Attending: Physical Medicine & Rehabilitation | Admitting: Physical Medicine & Rehabilitation

## 2023-09-07 DIAGNOSIS — R260 Ataxic gait: Secondary | ICD-10-CM

## 2023-12-30 IMAGING — CT CT ABD-PELV W/ CM
1 of 3 series · 14 of 32 positions shown, 19 images · IV contrast (agent unspecified)
Comparison: None Available.

CLINICAL DATA: Abnormal weight loss.

EXAM:
CT ABDOMEN AND PELVIS WITH CONTRAST
TECHNIQUE: Multidetector CT imaging of the abdomen and pelvis was performed
using the standard protocol following bolus administration of
intravenous contrast.

[Series 2: a/p w/ 5mm · axial · 0.88mm/px · z∈[-428,-4]mm · 14 of 97 slices shown, 19 images]
[im 6/97  soft-tissue]
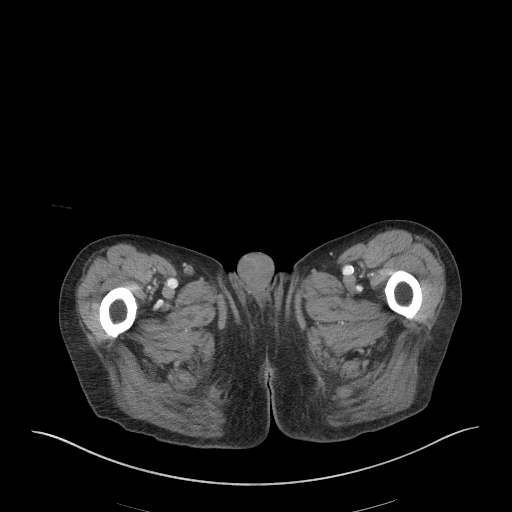
[im 6/97  bone]
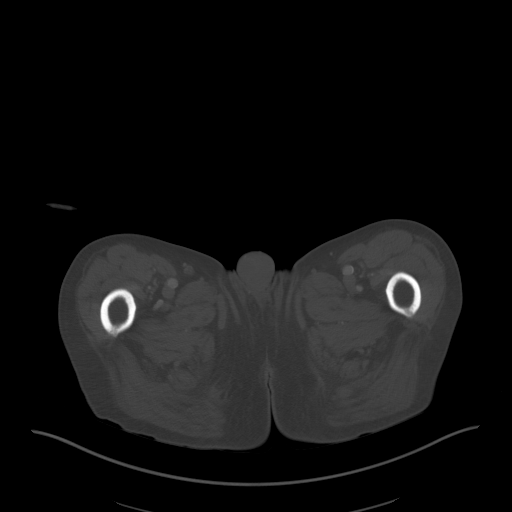
[im 16/97  soft-tissue]
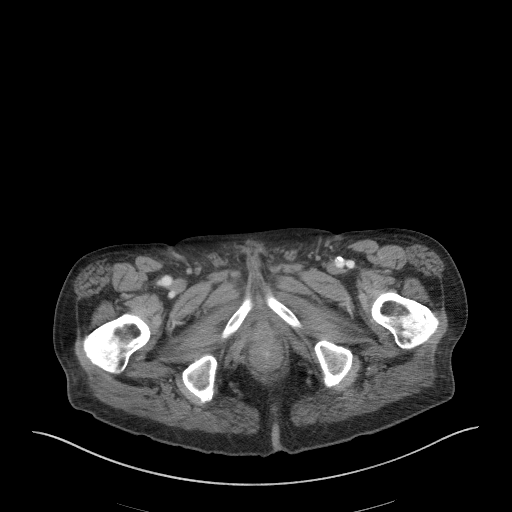
[im 21/97  soft-tissue]
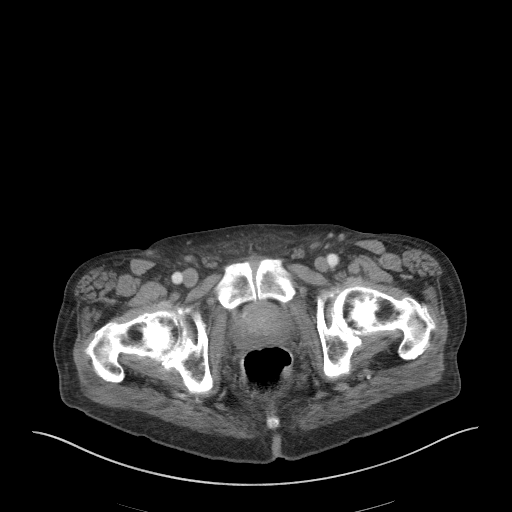
[im 26/97  soft-tissue]
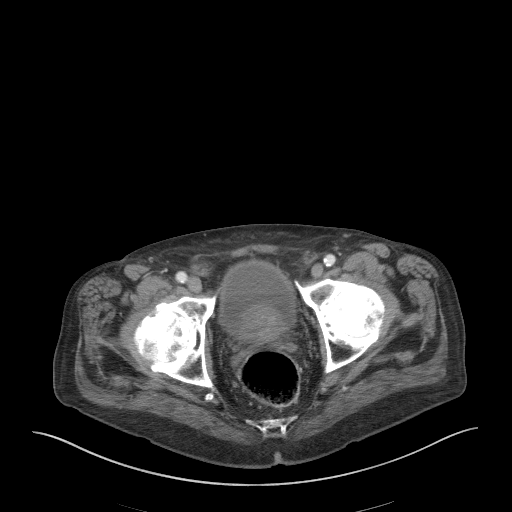
[im 36/97  soft-tissue]
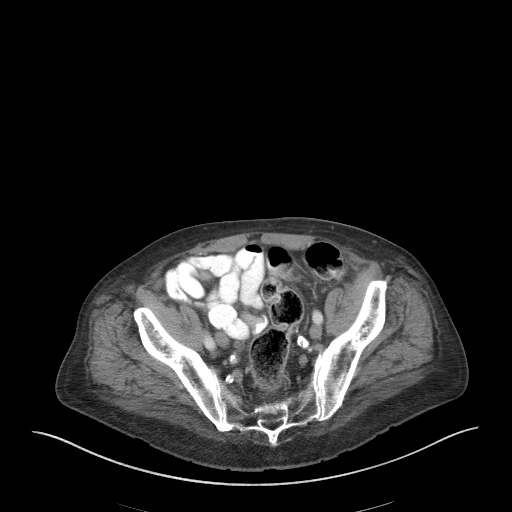
[im 41/97  soft-tissue]
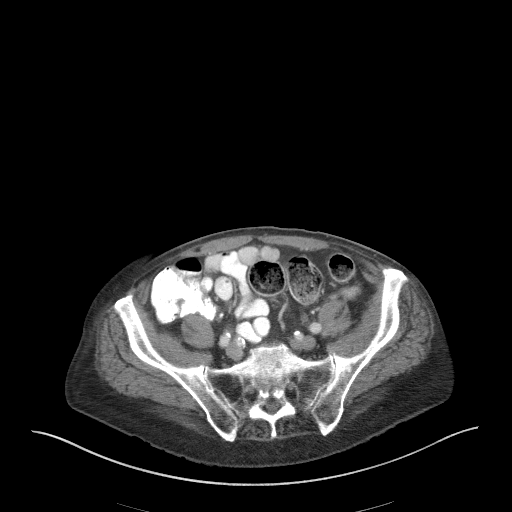
[im 51/97  soft-tissue]
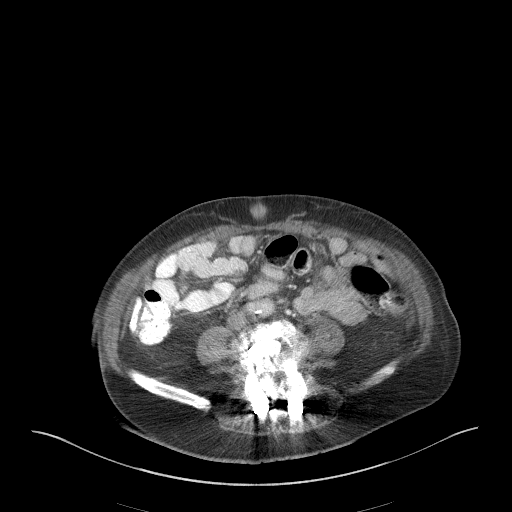
[im 56/97  soft-tissue]
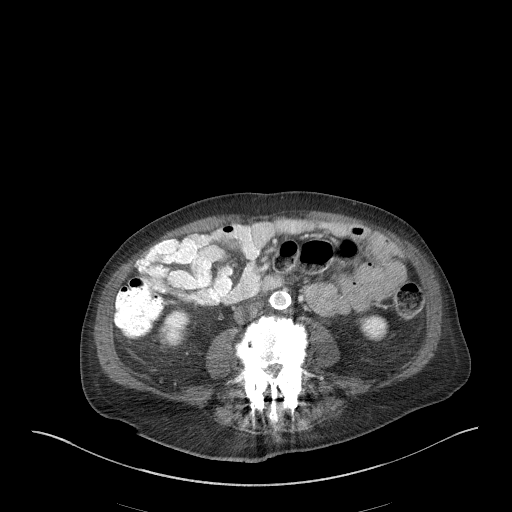
[im 61/97  soft-tissue]
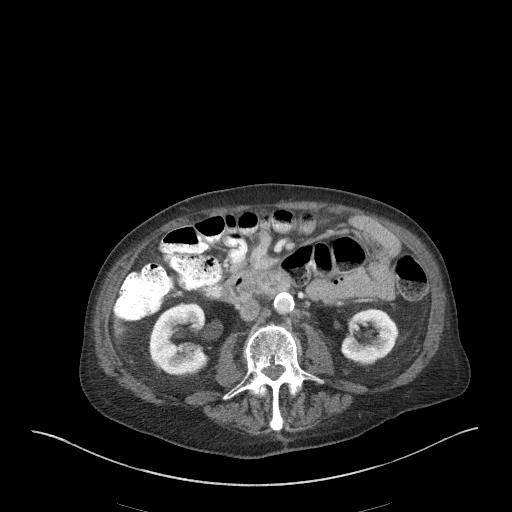
[im 61/97  bone]
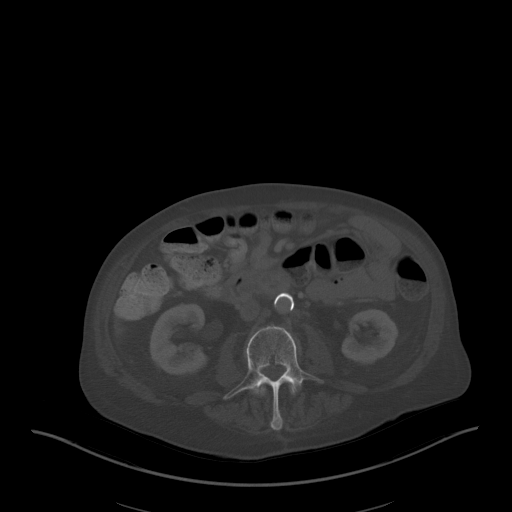
[im 71/97  soft-tissue]
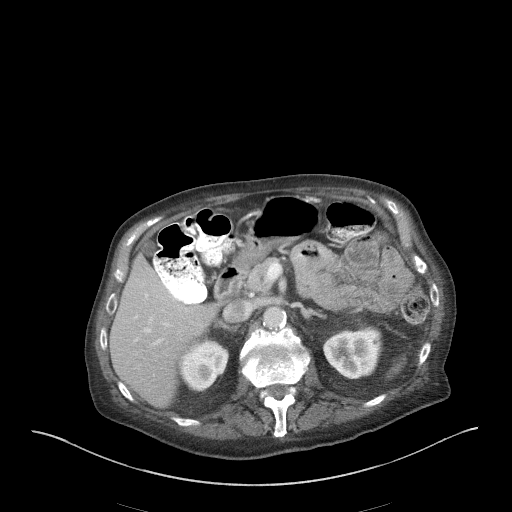
[im 76/97  soft-tissue]
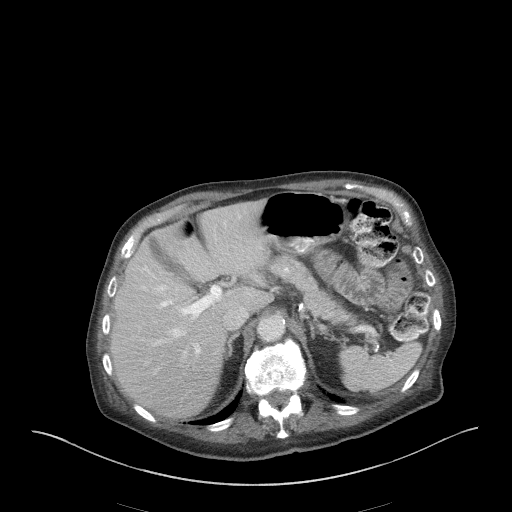
[im 76/97  lung]
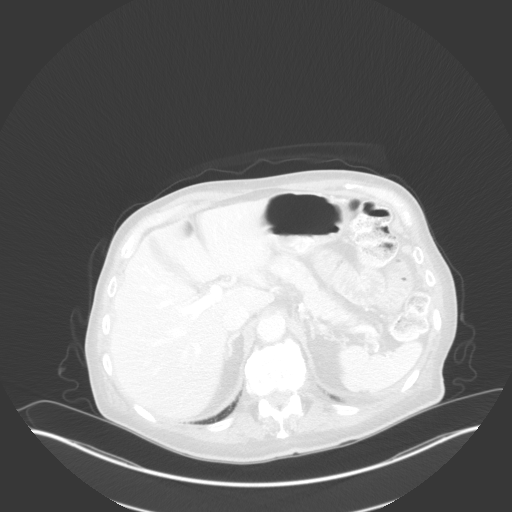
[im 81/97  soft-tissue]
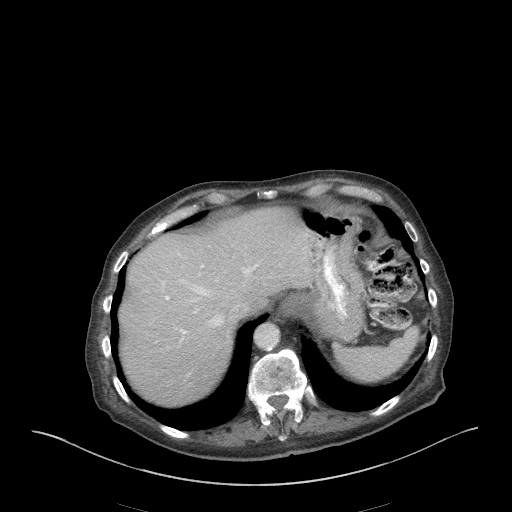
[im 81/97  lung]
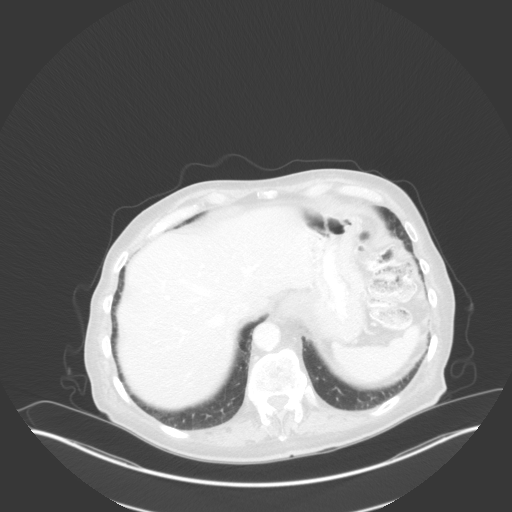
[im 86/97  lung]
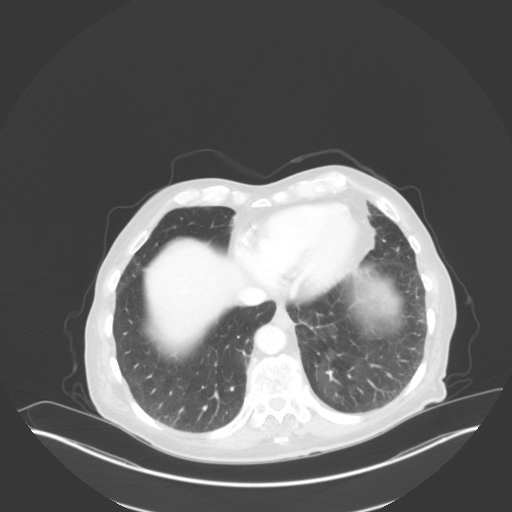
[im 91/97  soft-tissue]
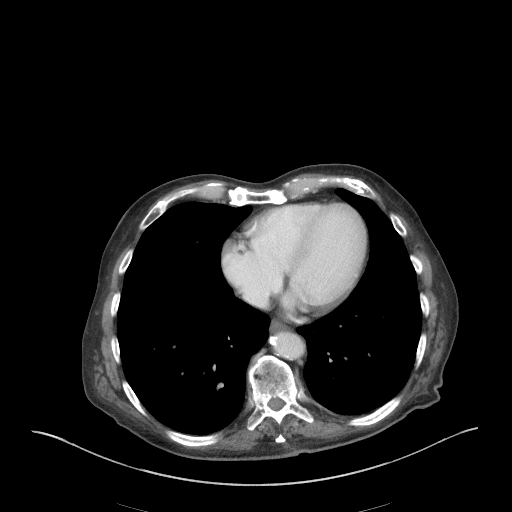
[im 91/97  lung]
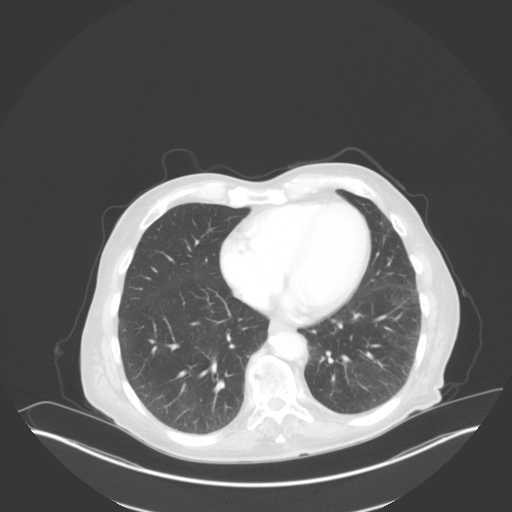

[14 of 32 positions shown; findings below may reference images not displayed]

RADIATION DOSE REDUCTION: This exam was performed according to the
departmental dose-optimization program which includes automated
exposure control, adjustment of the mA and/or kV according to
patient size and/or use of iterative reconstruction technique.

CONTRAST:  100mL 8V2O47-YEE IOPAMIDOL (8V2O47-YEE) INJECTION 61%
FINDINGS: Lower chest: No acute abnormality.

Hepatobiliary: A few liver cysts are identified, largest measures 8
mm in the dome of liver. The liver is otherwise unremarkable.
Gallbladder and biliary tree are normal.

Pancreas: Unremarkable. No pancreatic ductal dilatation or
surrounding inflammatory changes.

Spleen: Normal in size without focal abnormality.

Adrenals/Urinary Tract: Adrenal glands are unremarkable. Kidneys are
normal, without renal calculi, focal lesion, or hydronephrosis.
Bladder is unremarkable.

Stomach/Bowel: Stomach is within normal limits. Appendix appears
normal. No evidence of bowel wall thickening, distention, or
inflammatory changes.

Vascular/Lymphatic: Aortic atherosclerosis. No enlarged abdominal or
pelvic lymph nodes.

Reproductive: Prostate gland measures 5.8 cm in diameter.

Other: None.

Musculoskeletal: Degenerative joint changes are noted throughout the
spine. Patient status post prior fixation of the lower lumbar spine.
There is generalized diffuse osteopenia especially prominent pelvic
bones and bilateral proximal femurs.
IMPRESSION: 1. No acute abnormality identified in the abdomen and pelvis.
2. A few liver cysts.
3. Aortic atherosclerosis.

Aortic Atherosclerosis ([id number]-XVR.R).

## 2023-12-31 ENCOUNTER — Other Ambulatory Visit

## 2024-01-04 ENCOUNTER — Other Ambulatory Visit

## 2024-01-04 DIAGNOSIS — R972 Elevated prostate specific antigen [PSA]: Secondary | ICD-10-CM

## 2024-01-05 ENCOUNTER — Ambulatory Visit: Payer: Self-pay | Admitting: Urology

## 2024-01-05 LAB — PSA: Prostate Specific Ag, Serum: 8.3 ng/mL — ABNORMAL HIGH (ref 0.0–4.0)

## 2024-03-03 ENCOUNTER — Encounter: Payer: Self-pay | Admitting: Urology

## 2024-06-23 ENCOUNTER — Other Ambulatory Visit

## 2024-06-29 ENCOUNTER — Ambulatory Visit: Admitting: Urology

## 2024-06-30 ENCOUNTER — Ambulatory Visit: Admitting: Urology
